# Patient Record
Sex: Male | Born: 1950
Health system: Southern US, Community
[De-identification: ages and names within clinical notes are randomized; demographics above are authoritative.]

## PROBLEM LIST (undated history)

## (undated) DIAGNOSIS — F32A Depression, unspecified: Secondary | ICD-10-CM

## (undated) DIAGNOSIS — I6523 Occlusion and stenosis of bilateral carotid arteries: Secondary | ICD-10-CM

## (undated) DIAGNOSIS — E785 Hyperlipidemia, unspecified: Secondary | ICD-10-CM

## (undated) DIAGNOSIS — I10 Essential (primary) hypertension: Secondary | ICD-10-CM

## (undated) DIAGNOSIS — C439 Malignant melanoma of skin, unspecified: Secondary | ICD-10-CM

## (undated) DIAGNOSIS — I2510 Atherosclerotic heart disease of native coronary artery without angina pectoris: Secondary | ICD-10-CM

## (undated) DIAGNOSIS — F329 Major depressive disorder, single episode, unspecified: Secondary | ICD-10-CM

## (undated) HISTORY — DX: Depression, unspecified: F32.A

## (undated) HISTORY — DX: Atherosclerotic heart disease of native coronary artery without angina pectoris: I25.10

## (undated) HISTORY — DX: Occlusion and stenosis of bilateral carotid arteries: I65.23

## (undated) HISTORY — DX: Hyperlipidemia, unspecified: E78.5

## (undated) HISTORY — DX: Major depressive disorder, single episode, unspecified: F32.9

## (undated) HISTORY — PX: FRACTURE SURGERY: SHX138

## (undated) HISTORY — DX: Essential (primary) hypertension: I10

## (undated) HISTORY — DX: Malignant melanoma of skin, unspecified: C43.9

---

## 1999-09-23 ENCOUNTER — Encounter: Payer: Self-pay | Admitting: Family Medicine

## 1999-09-23 ENCOUNTER — Encounter: Admission: RE | Admit: 1999-09-23 | Discharge: 1999-09-23 | Payer: Self-pay | Admitting: Family Medicine

## 1999-10-03 ENCOUNTER — Encounter: Admission: RE | Admit: 1999-10-03 | Discharge: 1999-10-03 | Payer: Self-pay | Admitting: Family Medicine

## 1999-10-03 ENCOUNTER — Encounter: Payer: Self-pay | Admitting: Family Medicine

## 2005-06-12 ENCOUNTER — Encounter: Admission: RE | Admit: 2005-06-12 | Discharge: 2005-06-12 | Payer: Self-pay | Admitting: Family Medicine

## 2005-08-14 HISTORY — PX: CORONARY ARTERY BYPASS GRAFT: SHX141

## 2006-07-13 ENCOUNTER — Inpatient Hospital Stay (HOSPITAL_COMMUNITY): Admission: EM | Admit: 2006-07-13 | Discharge: 2006-07-23 | Payer: Self-pay | Admitting: Emergency Medicine

## 2006-07-16 ENCOUNTER — Encounter: Payer: Self-pay | Admitting: Vascular Surgery

## 2006-07-18 ENCOUNTER — Encounter (INDEPENDENT_AMBULATORY_CARE_PROVIDER_SITE_OTHER): Payer: Self-pay | Admitting: Specialist

## 2006-08-09 ENCOUNTER — Encounter (HOSPITAL_COMMUNITY): Admission: RE | Admit: 2006-08-09 | Discharge: 2006-11-07 | Payer: Self-pay | Admitting: Cardiology

## 2006-08-17 ENCOUNTER — Emergency Department (HOSPITAL_COMMUNITY): Admission: EM | Admit: 2006-08-17 | Discharge: 2006-08-17 | Payer: Self-pay | Admitting: Emergency Medicine

## 2006-09-09 ENCOUNTER — Emergency Department (HOSPITAL_COMMUNITY): Admission: EM | Admit: 2006-09-09 | Discharge: 2006-09-09 | Payer: Self-pay | Admitting: Emergency Medicine

## 2007-04-29 ENCOUNTER — Ambulatory Visit: Payer: Self-pay | Admitting: Surgery

## 2007-05-17 ENCOUNTER — Emergency Department (HOSPITAL_COMMUNITY): Admission: EM | Admit: 2007-05-17 | Discharge: 2007-05-17 | Payer: Self-pay | Admitting: Emergency Medicine

## 2007-06-20 IMAGING — CR DG CHEST 2V
2 series · 2 of 2 positions shown · non-contrast
Comparison: 08/17/2006 and multiple previous films.

CLINICAL DATA: Chest pain.  Short of breath.  Assess for pneumonia.
 CHEST - 2 VIEW:

[w chest pa]
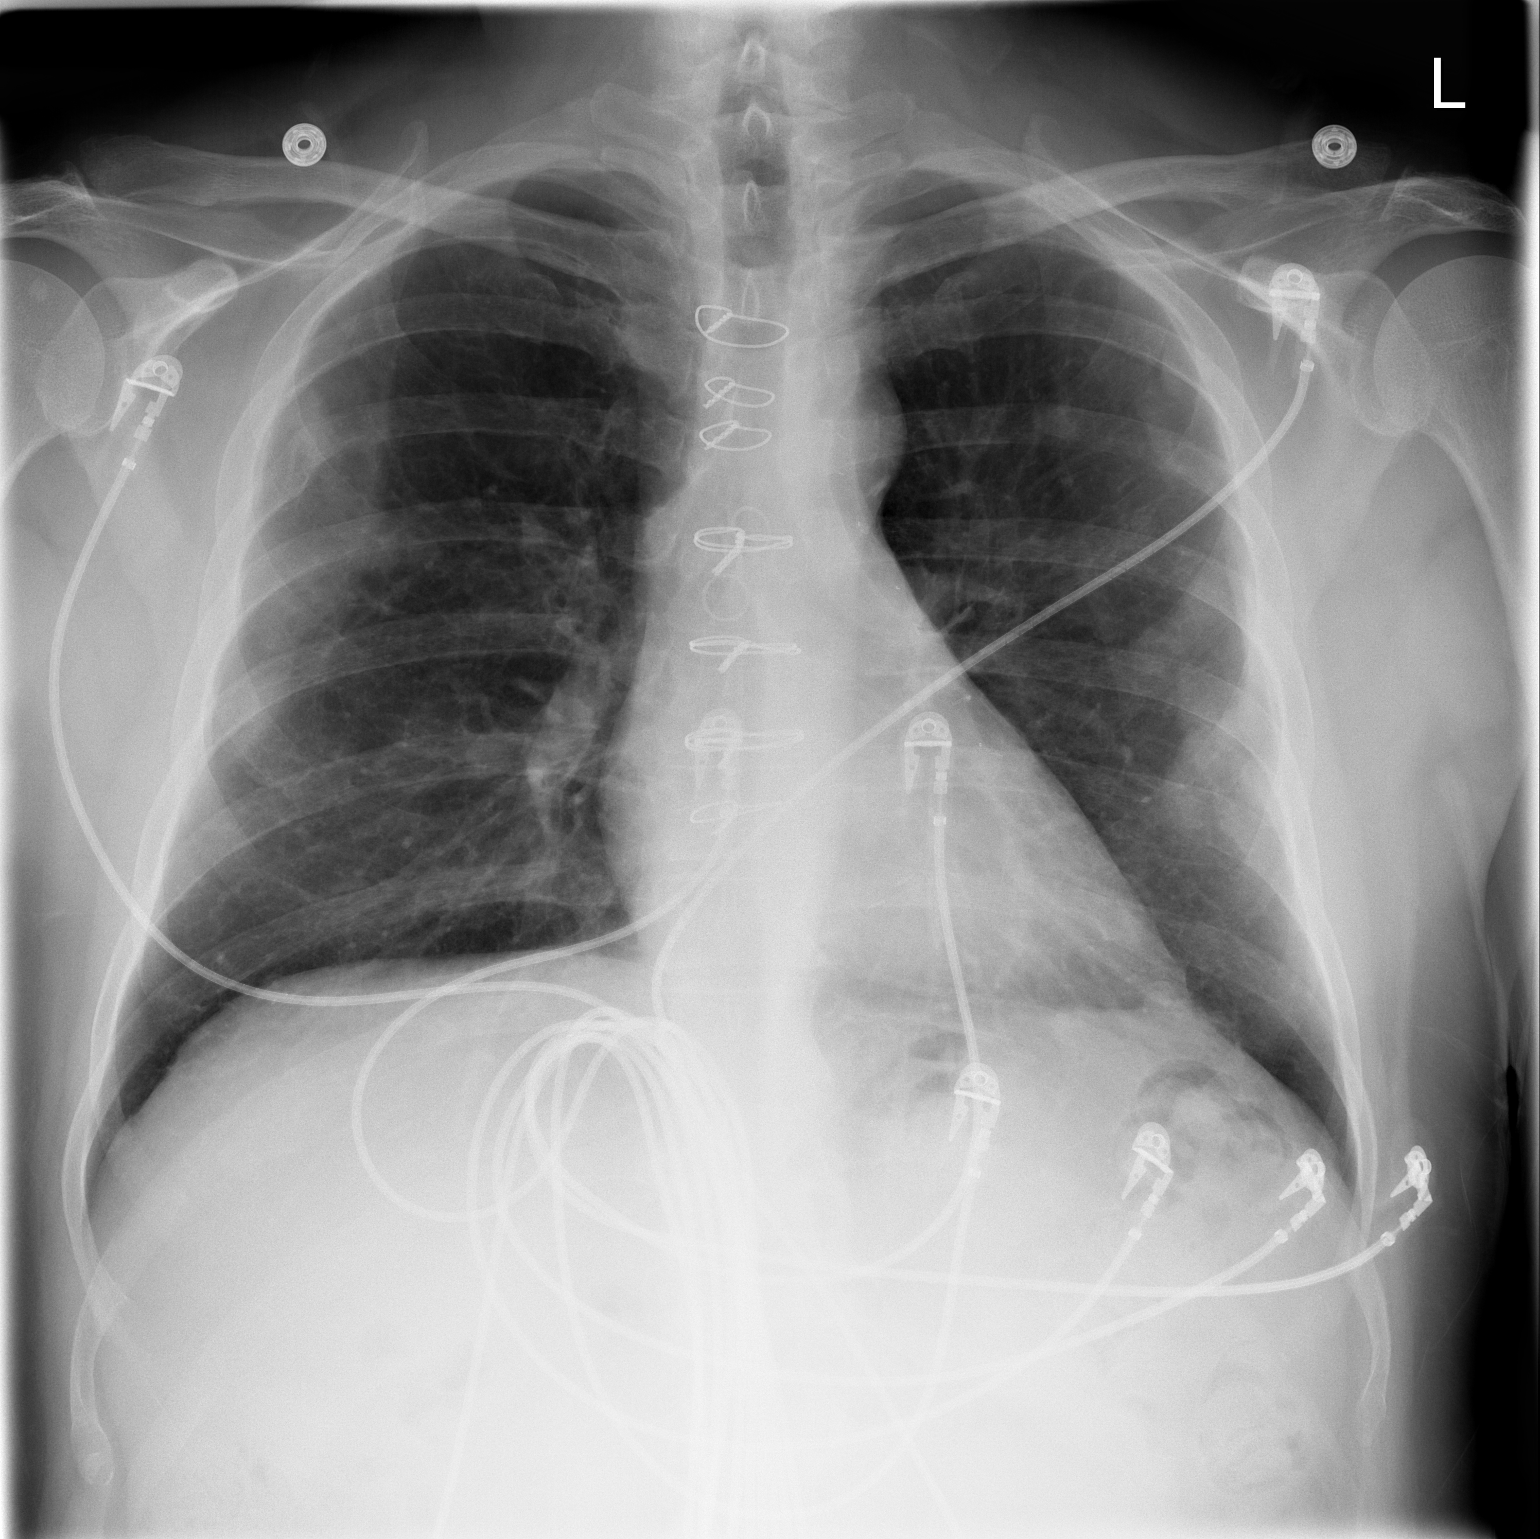

[w chest lat]
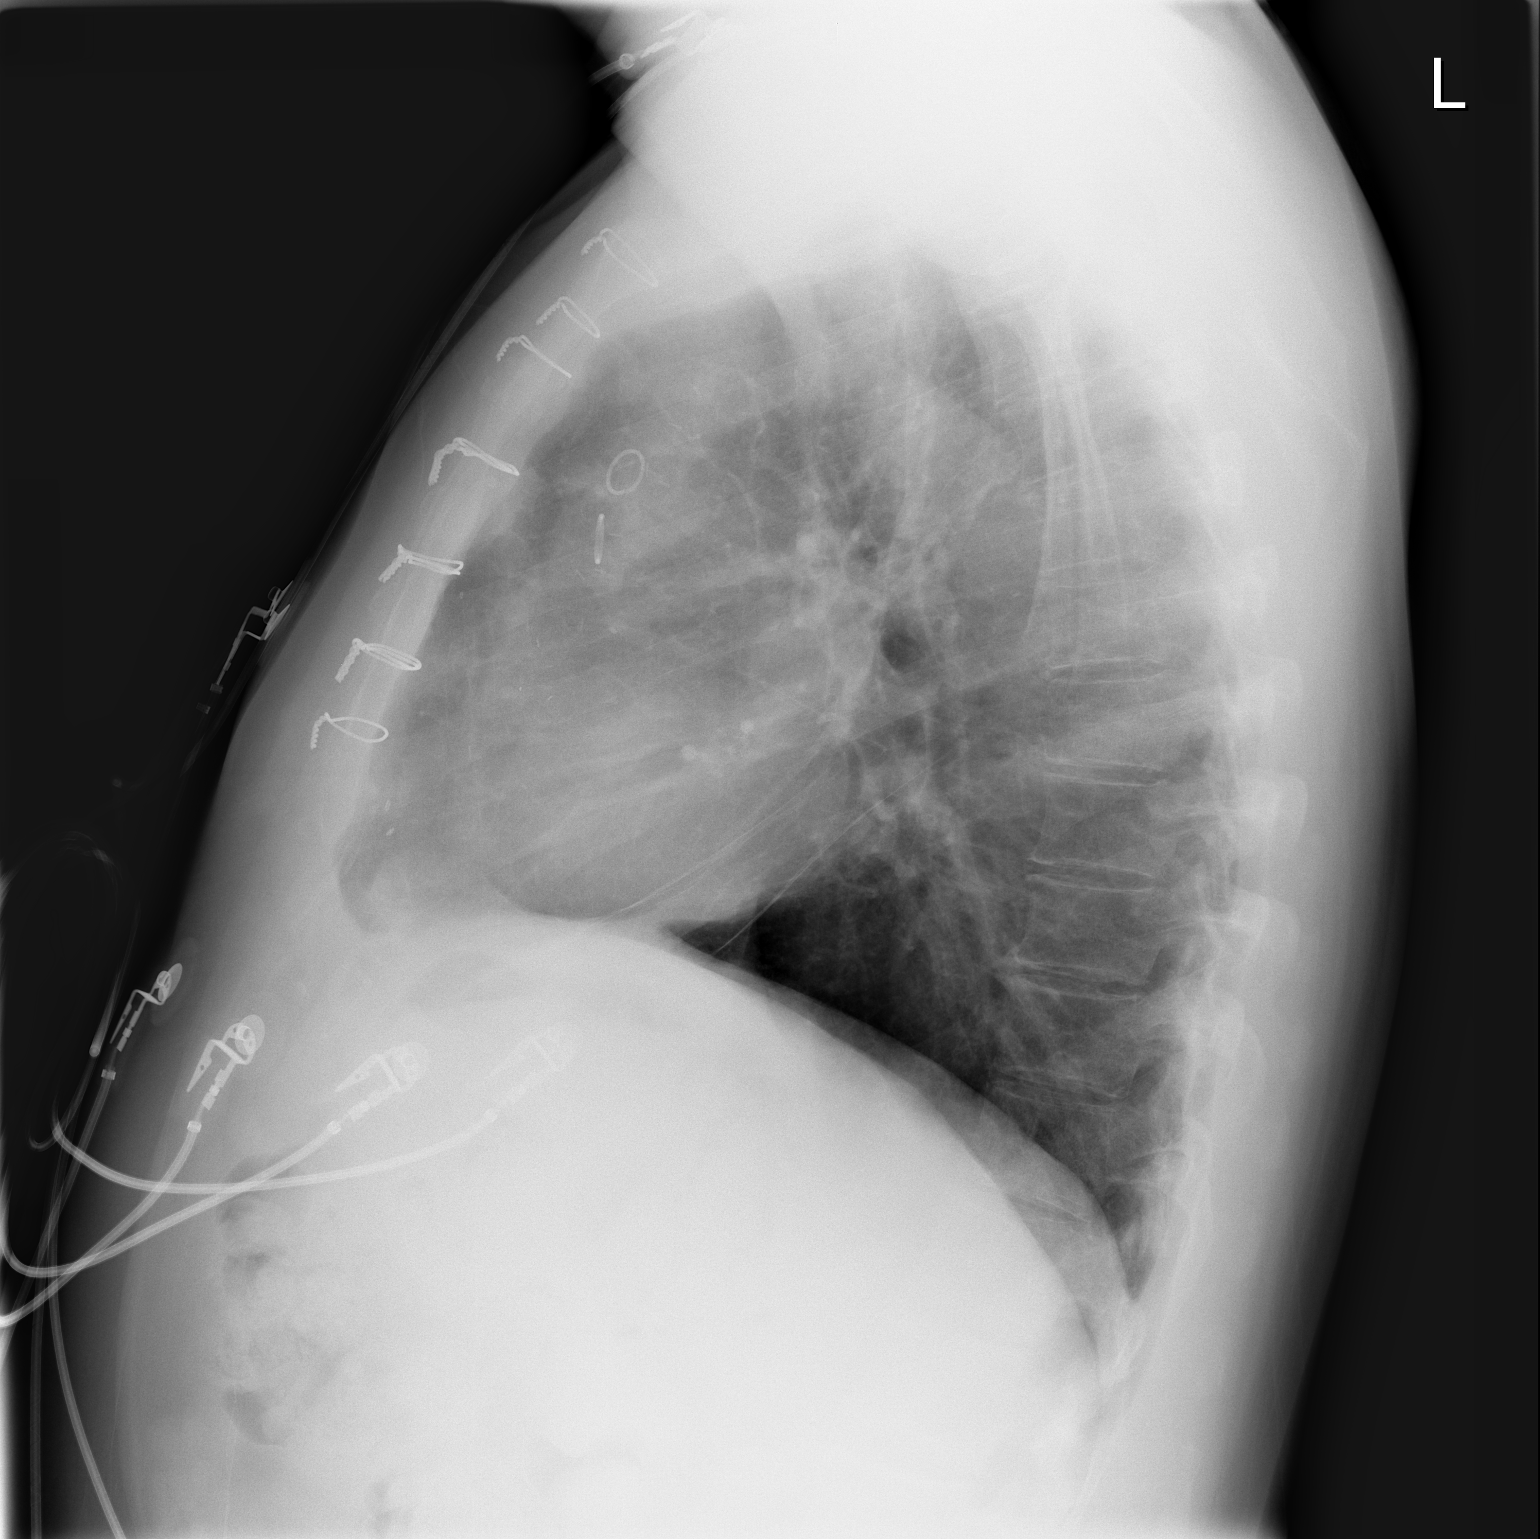

[2 of 2 positions shown; findings below may reference images not displayed]

FINDINGS: Artifact overlies the chest.  The patient has had a previous median sternotomy and coronary artery bypass grafting.  Pleural scarring remains evident bilaterally.  No evidence of acute infiltrate, mass, effusion or collapse.
IMPRESSION: 1.  Status post CABG. 
 2.  Chronic pleural and parenchymal densities. No sign of active process.

## 2008-08-14 DIAGNOSIS — C439 Malignant melanoma of skin, unspecified: Secondary | ICD-10-CM

## 2008-08-14 HISTORY — DX: Malignant melanoma of skin, unspecified: C43.9

## 2008-12-07 ENCOUNTER — Encounter: Admission: RE | Admit: 2008-12-07 | Discharge: 2008-12-07 | Payer: Self-pay | Admitting: Family Medicine

## 2009-08-18 ENCOUNTER — Emergency Department (HOSPITAL_COMMUNITY): Admission: EM | Admit: 2009-08-18 | Discharge: 2009-08-18 | Payer: Self-pay | Admitting: Emergency Medicine

## 2009-10-17 ENCOUNTER — Emergency Department (HOSPITAL_COMMUNITY): Admission: EM | Admit: 2009-10-17 | Discharge: 2009-10-18 | Payer: Self-pay | Admitting: Emergency Medicine

## 2009-10-18 ENCOUNTER — Inpatient Hospital Stay (HOSPITAL_COMMUNITY): Admission: AD | Admit: 2009-10-18 | Discharge: 2009-10-20 | Payer: Self-pay | Admitting: Psychiatry

## 2009-10-18 ENCOUNTER — Ambulatory Visit: Payer: Self-pay | Admitting: Psychiatry

## 2009-10-19 ENCOUNTER — Ambulatory Visit (HOSPITAL_COMMUNITY): Admission: RE | Admit: 2009-10-19 | Discharge: 2009-10-19 | Payer: Self-pay | Admitting: Psychiatry

## 2009-10-21 ENCOUNTER — Other Ambulatory Visit (HOSPITAL_COMMUNITY): Admission: RE | Admit: 2009-10-21 | Discharge: 2009-10-27 | Payer: Self-pay | Admitting: Psychiatry

## 2010-09-04 ENCOUNTER — Encounter: Payer: Self-pay | Admitting: Cardiology

## 2010-10-30 LAB — POCT I-STAT, CHEM 8
BUN: 12 mg/dL (ref 6–23)
Calcium, Ion: 1.24 mmol/L (ref 1.12–1.32)
Chloride: 105 meq/L (ref 96–112)
Creatinine, Ser: 1.2 mg/dL (ref 0.4–1.5)
Glucose, Bld: 119 mg/dL — ABNORMAL HIGH (ref 70–99)
HCT: 43 % (ref 39.0–52.0)
Hemoglobin: 14.6 g/dL (ref 13.0–17.0)
Potassium: 4.1 meq/L (ref 3.5–5.1)
Sodium: 139 meq/L (ref 135–145)
TCO2: 28 mmol/L (ref 0–100)

## 2010-10-30 LAB — POCT CARDIAC MARKERS
CKMB, poc: 1.9 ng/mL (ref 1.0–8.0)
Myoglobin, poc: 83.7 ng/mL (ref 12–200)
Troponin i, poc: <0.05 ng/mL (ref 0.00–0.09)
Troponin i, poc: <0.05 ng/mL (ref 0.00–0.09)

## 2010-10-30 LAB — COMPREHENSIVE METABOLIC PANEL WITH GFR
ALT: 23 U/L (ref 0–53)
AST: 25 U/L (ref 0–37)
Albumin: 4.1 g/dL (ref 3.5–5.2)
Alkaline Phosphatase: 34 U/L — ABNORMAL LOW (ref 39–117)
BUN: 10 mg/dL (ref 6–23)
CO2: 26 meq/L (ref 19–32)
Calcium: 9.9 mg/dL (ref 8.4–10.5)
Chloride: 103 meq/L (ref 96–112)
Creatinine, Ser: 1 mg/dL (ref 0.4–1.5)
GFR calc non Af Amer: >60 mL/min
Glucose, Bld: 116 mg/dL — ABNORMAL HIGH (ref 70–99)
Potassium: 3.6 meq/L (ref 3.5–5.1)
Sodium: 137 meq/L (ref 135–145)
Total Bilirubin: 0.8 mg/dL (ref 0.3–1.2)
Total Protein: 7.7 g/dL (ref 6.0–8.3)

## 2010-10-30 LAB — CBC
HCT: 40.5 % (ref 39.0–52.0)
MCV: 94.8 fL (ref 78.0–100.0)
Platelets: 225 10*3/uL (ref 150–400)
RBC: 4.28 MIL/uL (ref 4.22–5.81)
RDW: 13.1 % (ref 11.5–15.5)
WBC: 9.1 10*3/uL (ref 4.0–10.5)

## 2010-10-30 LAB — URINALYSIS, ROUTINE W REFLEX MICROSCOPIC
Bilirubin Urine: NEGATIVE
Glucose, UA: NEGATIVE mg/dL
Hgb urine dipstick: NEGATIVE
Ketones, ur: 15 mg/dL — AB
Nitrite: NEGATIVE
Protein, ur: NEGATIVE mg/dL
Specific Gravity, Urine: 1.018 (ref 1.005–1.030)
Urobilinogen, UA: 0.2 mg/dL (ref 0.0–1.0)
pH: 6 (ref 5.0–8.0)

## 2010-10-30 LAB — DIFFERENTIAL
Basophils Absolute: 0.1 K/uL (ref 0.0–0.1)
Basophils Relative: 2 % — ABNORMAL HIGH (ref 0–1)
Eosinophils Absolute: 0.3 K/uL (ref 0.0–0.7)
Eosinophils Relative: 3 % (ref 0–5)
Lymphocytes Relative: 20 % (ref 12–46)
Lymphs Abs: 1.8 K/uL (ref 0.7–4.0)
Monocytes Absolute: 0.8 K/uL (ref 0.1–1.0)
Monocytes Relative: 9 % (ref 3–12)
Neutro Abs: 6.1 K/uL (ref 1.7–7.7)
Neutrophils Relative %: 67 % (ref 43–77)

## 2010-10-30 LAB — LIPASE, BLOOD: Lipase: 26 U/L (ref 11–59)

## 2010-11-07 LAB — BASIC METABOLIC PANEL
CO2: 26 mEq/L (ref 19–32)
Calcium: 9.8 mg/dL (ref 8.4–10.5)
Chloride: 110 mEq/L (ref 96–112)
GFR calc Af Amer: >60 mL/min (ref >60)
Glucose, Bld: 87 mg/dL (ref 70–99)
Potassium: 4.1 mEq/L (ref 3.5–5.1)

## 2010-11-07 LAB — DIFFERENTIAL
Basophils Absolute: 0.3 10*3/uL — ABNORMAL HIGH (ref 0.0–0.1)
Basophils Relative: 4 % — ABNORMAL HIGH (ref 0–1)
Eosinophils Absolute: 0.2 10*3/uL (ref 0.0–0.7)
Eosinophils Relative: 2 % (ref 0–5)
Lymphocytes Relative: 29 % (ref 12–46)

## 2010-11-07 LAB — CBC
Hemoglobin: 14.7 g/dL (ref 13.0–17.0)
MCHC: 34.3 g/dL (ref 30.0–36.0)
MCV: 93.8 fL (ref 78.0–100.0)
RDW: 12.8 % (ref 11.5–15.5)

## 2010-11-07 LAB — RAPID URINE DRUG SCREEN, HOSP PERFORMED: Barbiturates: NOT DETECTED

## 2010-11-07 LAB — ETHANOL: Alcohol, Ethyl (B): <5 mg/dL (ref 0–10)

## 2010-12-27 NOTE — Procedures (Signed)
CAROTID DUPLEX EXAM   INDICATION:  Follow up known carotid artery disease.   HISTORY:  Diabetes:  No  Cardiac:  Yes  Hypertension:  Yes  Smoking:  Quit in 1990  Previous Surgery:  No  CV History:  No  Amaurosis Fugax No, Paresthesias No, Hemiparesis No                                       RIGHT             LEFT  Brachial systolic pressure:         120               130  Brachial Doppler waveforms:         Biphasic          Biphasic  Vertebral direction of flow:        Antegrade         Antegrade  DUPLEX VELOCITIES (cm/sec)  CCA peak systolic                   100               79  ECA peak systolic                   103               80  ICA peak systolic                   61                83  ICA end diastolic                   20                31  PLAQUE MORPHOLOGY:                  Heterogenous      Heterogenous  PLAQUE AMOUNT:                      Mild              Mild  PLAQUE LOCATION:                    ICA, ECA          ICA, ECA   IMPRESSION:  1-39% stenosis noted in the bilateral internal carotid  arteries.  Antegrade bilateral vertebral arteries.   ___________________________________________  Janetta Hora Fields, MD   MG/MEDQ  D:  04/29/2007  T:  04/30/2007  Job:  [id number]

## 2010-12-30 NOTE — H&P (Signed)
[name], [name]                ACCOUNT NO.:  [id number]   MEDICAL RECORD NO.:  [id number]          PATIENT TYPE:  INP   LOCATION:  3729                         FACILITY:  [facility]   PHYSICIAN:  Verdell Face Muse, PA    DATE OF BIRTH:  01-30-1951   DATE OF ADMISSION:  07/13/2006  DATE OF DISCHARGE:                              HISTORY & PHYSICAL   PRIMARY CARE PHYSICIAN:  L. Lupe Carney, M.D.   CARDIOLOGIST:  Corliss Marcus, M.D.   CHIEF COMPLAINT:  Chest pressure.   HISTORY OF PRESENT ILLNESS:  Mr. [name] [name] is a 60 year old Caucasian male  without a known history of coronary artery disease.  He has a history of  hypertension and hyperlipidemia with a recent exercise treadmill test  that was positive for inducible ischemia performed on July 09, 2006.  He presented to his primary care physician's office today with a chief  complaint of a sudden onset of nonexertional, anterior chest pressure  that started at 1:00 a.m. with radiation to his left breast with a  burning sensation, his left shoulder and left arm with mild numbness in  his left arm.  The duration was 6 hours with the chest pressure being  constant for the first 3 hours, then becoming intermittent.  He admits  to shortness of breath as well as episodes of dizziness and  (lightheadedness x2) with the duration of approximately 15 seconds.  There were no other associated symptoms.  There were no changes of the  chest pressure with deep inspiration, movement, or meals.  The patient  admits to a recent history of chest pressure for the past 7-8 weeks.  He  is currently chest pressure free but states it is difficult to take a  deep breath.  His EKG revealed sinus bradycardia at 58 beats per minute  without evidence of ischemia.   PAST MEDICAL HISTORY:  1. Hypertension.  2. Hyperlipidemia.  3. Recent diagnosis of prostate infection, currently on antibiotics   ALLERGIES:  Penicillin.   CURRENT MEDICATIONS:  1.  Simvastatin 40 mg daily.  2. HCTZ 25 mg daily.  3. Doxycycline monohydrate 100 mg twice daily x 30 days.   PAST SURGICAL HISTORY:  Vasectomy.   FAMILY HISTORY:  1. Father deceased age 83 - MI in his 77s.  2. Mother living age 87 - dementia.  3. Three brothers living ages 11, 42, and 60 - two with MI's in their      89s and another with a possible arrhythmia.  4. Sister living age 58 - healthy.   SOCIAL HISTORY:  Married to his second wife for 12 years with two adult  children - healthy.  He is a Naval architect.  He is a former smoker with a  24 pack year history with cessation 18 years ago.  He admits to 24  ounces of beer per week and denies illicit drug use.   REVIEW OF SYSTEMS:  All other systems reviewed are negative other than  what is stated in the HPI.   PHYSICAL EXAM:  GENERAL:  A 60 year old male, pleasant and cooperative,  NAD.  VITALS:  Temperature 97.7, blood pressure 145/91, pulse 57,  respirations 19, O2 saturations 99% over room air.  HEENT: Unremarkable.  NECK: Supple without JVD or carotid bruits, bilaterally.  PULMONARY:  Breath sounds are equal and clear to auscultation bilaterally.  No use  of accessory muscles.  CV bradycardia.  Normal S1 and S2.  No murmurs,  gallops, clicks or rubs.  ABDOMEN: Soft, nontender, nondistended with  active bowel sounds.  No masses, organomegaly, or bilateral bruits.  EXTREMITIES: No peripheral edema.  DP and PT pulses 2+/2, bilaterally.  SKIN:  Warm and dry without rashes or lesions.  NEURO: No focal sensory  or motor deficits.  PSYCH:  Normal mood and affect.   LABORATORY DATA:  White blood count 6.0, hemoglobin 14.3, hematocrit  40.9, platelets 259,000.  Sodium 138, potassium 3.7, chloride 107, CO2  25.9, BUN 18, creatinine 1.2, glucose 100, PT 13.9, INR 1.1, PTT 31, D-  dimer less than 0.22, BNP less than 30.0, magnesium 2.3, EKG as stated  in the HPI.  A chest x-ray bilateral pleural plaques.  No evidence of  cardiopulmonary  disease.  Point of care enzymes myoglobin 115, CK-MB  less than 1.0.  Troponin I less than 0.05.  Serial enzymes CK total 81,  CK-MB 1.4, troponin I 0.01.   ASSESSMENT:  1. Acute coronary syndrome consistent with unstable angina.  2. Recent treadmill test positive for inducible ischemia on July 09, 2006.  3. Hypertension, controlled.  4. Dyslipidemia.   PLAN:  1. MI ruled out.  Cardiac enzymes are negative. EKG is negative for      ischemic changes.  2. Admit to cardiac telemetry unit under the service of Corliss Marcus,      MD with the diagnosis of unstable angina.  3. Start subcu Lovenox every 12 hours per pharmacy protocol.  4. IV nitroglycerin at 3 mcg per minute.  Titrate at 5-10 mcg per      minute as necessary for chest pain relief keeping systolic blood      pressure greater than 100 mmHg.  5. Continue to cycle cardiac enzymes q.8 h x3.  6. Start coated aspirin 325 mg daily.  7. Continue home medications.  8. Diagnostic cardiac catheterization on Monday, July 16, 2006 to      be performed by Dr. Corliss Marcus.  The cardiac catheterization      procedure, risks, and potential complications were explained to the      patient by Dr. Amil Amen including MI, CVA, death, contrast dye      allergy, renal insufficiency, and vascular complications including      bleeding, hematoma, and pseudoaneurysm.  The patient admits to full      understanding of the information and wishes to proceed.  9. The patient was seen, interviewed, and examined by Dr. Amil Amen who      participated in the medical decision making and plan of care.      Tylene Fantasia, [place]    RDM/MEDQ  D:  07/13/2006  T:  07/15/2006  Job:  [id number]   cc:   L. Lupe Carney, M.D.

## 2010-12-30 NOTE — Consult Note (Signed)
NAMELOPEZ, DENTINGER NO.:  [id number]   MEDICAL RECORD NO.:  [id number]          PATIENT TYPE:  INP   LOCATION:  3729                         FACILITY:  [facility]   PHYSICIAN:  Sheliah Plane, MD    DATE OF BIRTH:  1950-11-25   DATE OF CONSULTATION:  07/16/2006  DATE OF DISCHARGE:                                 CONSULTATION   REQUESTING PHYSICIAN:  Armanda Magic, M.D.   Elenor Quinones CARDIOLOGIST:  Francisca December, M.D.   PRIMARY CARE PHYSICIAN:  L. Lupe Carney, M.D.   REASON FOR CONSULTATION:  Coronary occlusive disease.   HISTORY OF PRESENT ILLNESS:  The patient is a 60 year old white male  with a history of hyperlipidemia and hypertension who had noticed  increasing episodes of fatigue over the past several months, including  heaviness in his chest associated with shortness of breath which  precipitated his admission into the hospital 2 days ago.  Since  admission, he has had no other pain.  He noted the pain as a burning  sensation that radiated to the left breast and left shoulder with  shortness of breath and slight dizziness.  Denies diaphoresis.  On  November 26, he had undergone an exercise treadmill test which was  positive for inducible ischemia.  He was scheduled for a nuclear study  to be done next week, but because of the episode of prolonged chest  pain, was admitted on July 13, 2006, with unstable angina.   Cardiac risk factors include history of hypertension, history of  hyperlipidemia, history of recent prostate infection on doxycycline for  30 days.  He denies diabetes.  He is a remote smoker, having smoked  approximately 20 years and quit 18 years ago.  He has a positive  history.  His father had a myocardial infarction at age 52, ultimately  expired from complications from motor vehicle accident.  He has 3  brothers; 2 have had MIs, one sister who is healthy.  The patient has 2  adult children who are healthy.   PAST SURGICAL HISTORY:   None.   SOCIAL HISTORY:  The patient is married, employed as a Naval architect.  Drinks an occasional beer, no more than 2 per week.   MEDICATIONS ON ADMISSION:  1. Simvastatin 40 mg a day.  2. Hydrochlorothiazide 25 mg a day.  3. Doxycycline 100 mg b.i.d. for 30 days.  He is 1/2 through the      course.   DRUG ALLERGIES:  PENICILLIN causes a rash.   REVIEW OF SYSTEMS:  CARDIAC: Positive for exertional shortness of breath  and chest pain.  Denies lower extremity edema, denies palpitations,  denies syncope, presyncope, or orthopnea.  Denies any constitutional  symptoms, denies hemoptysis.  He has had no change in bowel habits.  He  does note that he had a sigmoidoscopy done in September that was clear.  Recent urinary tract infection/prostate infection, though he notes no  cultures were done.  Denies psychiatric history.  Denies easy  bruisability.   PHYSICAL EXAMINATION:  VITAL SIGNS: Blood pressure 128/70, pulse 60.  He  is  5 feet 8 inches tall, weighs 200 pounds.  GENERAL:  The patient is awake, alert, neurologically intact, and able  to relate his history without difficulty.  HEENT:  Pupils equal, round, and reactive to light.  NECK:  Without carotid bruits or jugular venous distention.  LUNGS: Clear bilaterally.  CARDIAC: Regular rate and rhythm without murmur or gallop.  ABDOMEN: Soft without palpable masses.  The aorta is not palpably  enlarged.  Right groin site is without significant hematoma.  VASCULAR: Exam reveals 2+ DP and PT pulses bilaterally.   LABORATORY DATA ON ADMISSION:  White count 6000, hematocrit 40.9.  Creatinine 1.2, BUN 18.  INR 1.1, PTT 31.  BNP less than 30.  Troponin,  CK-MB, and myoglobin were not elevated.   Cardiac catheterization was performed today by Dr. Mayford Knife which showed  70-80% mid right lesion with bifurcating distal vessel.  The left main  was without high-grade stenosis.  The proximal circumflex and first  obtuse marginal have 70-80%  stenosis.  Two small distal branches of the  circumflex are small, probably not bypassable.  The LAD has multiple 50%  lesions at the takeoff of a diagonal branch as much as 70%.   ASSESSMENT AND PLAN:  A patient with unstable anginal symptoms, positive  stress test, and significant 3-vessel disease not amenable to  angioplasty.  I agree with recommendation for coronary artery bypass  grafting.  The risks and options have been discussed with the patient  and his wife and daughter in detail, the risks of death, infection,  stroke, myocardial infarction, bleeding, and blood transfusion all  discussed in detail.  The patient had his questions answered and is  willing to proceed.  With his unstable anginal presentation, he should  remain on heparin in the hospital until surgery planned on Wednesday,  July 18, 2006.      Sheliah Plane, MD  Electronically Signed     EG/MEDQ  D:  07/16/2006  T:  07/16/2006  Job:  [id number]   cc:   Armanda Magic, M.D.  Francisca December, M.D.  Elsworth Soho, M.D.

## 2010-12-30 NOTE — Cardiovascular Report (Signed)
NAMESEVERINO, PAOLO                ACCOUNT NO.:  [id number]   MEDICAL RECORD NO.:  [id number]          PATIENT TYPE:  INP   LOCATION:  3729                         FACILITY:  [facility]   PHYSICIAN:  Armanda Magic, M.D.     DATE OF BIRTH:  1950/12/03   DATE OF PROCEDURE:  07/16/2006  DATE OF DISCHARGE:                            CARDIAC CATHETERIZATION   REFERRING PHYSICIAN:  L. Lupe Carney, M.D.   PROCEDURE:  Left heart catheterization, coronary angiography, left  ventriculography.   OPERATOR:  Armanda Magic, M.D.   INDICATIONS:  Chest pain and positive stress test.   COMPLICATIONS:  None.   IV ACCESS:  The right femoral artery 6-French sheath.   This is a very pleasant 60 year old white male who has been having  intermittent chest pain and presented for stress test which was  positive.  He had recurrent chest pain and was admitted the hospital. He  now presents for heart catheterization.   The patient is brought to cardiac catheterization laboratory in a  fasting nonsedated state.  Informed consent was obtained.  The patient  was connected to continuous heart rate and pulse oximetry monitoring and  intermittent blood pressure monitoring.  The right groin was prepped and  draped in sterile fashion.  1% Xylocaine was used for local anesthesia.  Using modified Seldinger technique a 6-French sheath was placed in right  femoral artery.  Under fluoroscopic guidance a 6-French JL-4 catheter  was placed left coronary artery.  Multiple cine films were taken at 30  degree RAO and 40 degree LAO views.  Catheter was exchanged out over a  guidewire for a 6-French JR-4 catheter was placed in fluoroscopic  guidance right coronary artery.  Multiple cine films were taken at 30  degree RAO and 40 degree LAO views.  Catheter was exchanged out over a  guidewire for 6-French angled pigtail catheter which was placed in  fluoroscopic guidance in left ventricular cavity.  Left ventriculography  was  performed in 30 degrees RAO view using total of 30 mL of contrast at  15 mL per second.  The catheter was then pulled back across the aortic  valve with no significant gradient noted.  At the end of procedure all  catheters and sheaths were removed.  Manual compression was performed  until adequate hemostasis was obtained.  The patient transferred back to  room in stable condition.   RESULTS:  1. Left main coronary artery is widely patent and bifurcates in left      anterior descending artery and left circumflex artery.  2. The left anterior descending artery is calcified in its proximal      portion with a 75-80% narrowing.  It gives rise to a first diagonal      which is very small and has a 90% ostial narrowing.  The ongoing      LAD gives rise to a second diagonal and just distal the second      diagonal there is a stenosis of the LAD of 60-70%.  And then the      distal LAD there is a 70-80%  stenosis.  3. The left circumflex is widely patent throughout its course giving      rise to a first obtuse marginal branch which is very small.  It      gives rise to a second larger obtuse marginal branch which is      widely patent and bifurcates into two daughter branches both of      which are widely patent.  It gives rise to a third obtuse marginal      branch which is widely patent.  The ongoing circumflex is widely      patent throughout its course distally.  4. The right coronary artery is widely patent proximal portion and      then in the midportion there is a 70-80% stenosis and distally a 30-      40% stenosis, before bifurcating into posterior descending artery      and posterior lateral artery both of which are widely patent.  5. Left ventriculography shows normal LV systolic function.  Aortic      pressure 131/56 mmHg, LV pressure 122/3 mmHg, LVEDP 17 mmHg.   ASSESSMENT:  1. Two-vessel coronary disease.  2. Normal LV function.  3. Angina.  4. Hypertension.  5.  Dyslipidemia.   PLAN:  CVTS consult, start IV heparin drip.      Armanda Magic, M.D.  Electronically Signed     TT/MEDQ  D:  07/16/2006  T:  07/16/2006  Job:  [id number]   cc:   L. Lupe Carney, M.D.

## 2010-12-30 NOTE — Op Note (Signed)
[name] [name], [name] [name]                ACCOUNT NO.:  [id number]   MEDICAL RECORD NO.:  [id number]          PATIENT TYPE:  INP   LOCATION:  2312                         FACILITY:  [facility]   PHYSICIAN:  Sheliah Plane, MD    DATE OF BIRTH:  [birth year]   DATE OF PROCEDURE:  07/18/2006  DATE OF DISCHARGE:                               OPERATIVE REPORT   PREOPERATIVE DIAGNOSIS:  Coronary occlusive disease, with unstable  angina.   POSTOPERATIVE DIAGNOSIS:  Coronary occlusive disease, with unstable  angina.   SURGICAL PROCEDURE:  Coronary artery bypass grafting x 3, with a left  internal mammary to the left anterior descending coronary artery,  reverse saphenous vein graft to the first obtuse marginal, reverse  saphenous vein graft to the posterior descending coronary artery, with  right leg Endovein harvesting.   SURGEON:  Sheliah Plane, M.D.   FIRST ASSISTANT:  Stephanie Acre. Dominick, P.A.   BRIEF HISTORY:  The patient is a 60 year old male who presented with  unstable anginal symptoms after a stress test done in Dr. Norris Cross  office.  The patient underwent cardiac catheterization which revealed  significant three-vessel coronary artery disease with sequential 70 and  80% lesions of the LAD, which was overall a very small vessel.  The  first obtuse marginal had a 70% lesion, and two smaller distal  circumflex branches were both too small to bypass, being less than 1 mm.  The right coronary artery had a mid to distal 80-90% stenosis.  Overall  ventricular function preserved.  Because of the patient's diffuse three-  vessel coronary artery disease, coronary artery bypass grafting was  recommended.  The patient agreed and signed informed consent.   DESCRIPTION OF THE PROCEDURE:  With Swan-Ganz and arterial line monitors  in place, the patient underwent general endotracheal anesthesia without  incident.  The skin of the chest and legs was prepped with Betadine and  draped in the usual  sterile manner.  Using a [employer] Endovein harvesting  system, vein was harvested from the right thigh and the upper part of  the right calf.  The vein was of adequate quality and caliber.  Median  sternotomy was performed.  Left internal mammary artery was dissected  down as a pedicle graft.  The distal artery was divided and had good  free flow.  Pericardium was opened.  Overall ventricular function  appeared preserved.  The patient was systemically heparinized.  The  ascending aorta was cannulated.  The right atrium cannulated.  The  patient was placed on cardiopulmonary bypass at 2.4 liters/minute/sq m.  Sites of anastomosis were selected and dissected at the epicardium.  The  patient's body temperature was cooled to 30 degrees.  Aortic cross-clamp  was applied, and 500 cc of cold blood potassium cardioplegia was  administered, with rapid diastolic arrest of the heart.  Myocardial  septal temperature was monitored throughout the cross-clamp.  Attention  was turned first to the takeoff of the posterior descending coronary  artery, which was opened and admitted a 1.5 mm probe.  Using a running 7-  0 Prolene, distal anastomosis was  performed.  Attention was then turned  to the obtuse marginal vessels.  The second and third obtuse marginal  vessels were extremely small, too small to bypass, less than 1 mm in  size.  The first obtuse marginal was intramyocardial.  It was dissected  out, and it too was a small vessel, but was opened and admitted a 1 mm  probe.  Using a running 7-0 Prolene, a distal anastomosis was performed.  Attention was then turned to the left anterior descending coronary  artery.  This vessel was also very small and diffusely diseased.  In the  distal third of the vessel, the artery was opened and admitted a 1 mm  probe distally and proximally.  The vessel was approximately 1.2-1.3 mm  in size.  Using a running 8-0 Prolene, the left internal mammary artery  was  anastomosed to the left anterior descending coronary artery.  With  the aortic cross-clamp still in place, two punch aortotomies were  performed on the ascending aorta.  Each of the two vein grafts were  anastomosed to the ascending aorta.  Air was evacuated from the grafts.  The bulldog was removed from the mammary artery, with prompt rise in  myocardial septal temperature.  The heart was allowed to passively fill,  and the ascending aorta was de-aired. Aortic cross-clamp was removed.  Total cross-clamp time was 65 minutes.  Sites of anastomosis were  inspected and free of bleeding.  The patient was then ventilated and  weaned from cardiopulmonary bypass without difficulty.  Atrial and  ventricular pacing wires were applied.  Two graft markers were applied.  With the operative field hemostatic, a left pleural tube and a  mediastinal Blake drain were left pin place.  Pericardium was  reapproximated.  Sternum was closed with #6 stainless steel wire.  Fascia was closed with interrupted 0 Vicryl, running 3-0 Vicryl in the  subcutaneous tissue, 4-0 subcuticular stitch in the skin edges.  Dry  dressings were applied.  Sponge and needle count was reported as correct  at the completion of the procedure.  The patient tolerated the procedure  without obvious complication and was transferred to the surgical  intensive care unit for further postoperative care.      Sheliah Plane, MD  Electronically Signed     EG/MEDQ  D:  07/18/2006  T:  07/18/2006  Job:  [id number]   cc:   Francisca December, M.D.  Armanda Magic, M.D.  Elsworth Soho, M.D.

## 2010-12-30 NOTE — Discharge Summary (Signed)
NAMEESTIL, VALLEE NO.:  [id number]   MEDICAL RECORD NO.:  [id number]          PATIENT TYPE:  INP   LOCATION:  2034                         FACILITY:  [facility]   PHYSICIAN:  Sheliah Plane, MD    DATE OF BIRTH:  1951/01/07   DATE OF ADMISSION:  07/13/2006  DATE OF DISCHARGE:  07/23/2006                               DISCHARGE SUMMARY   PRIMARY DIAGNOSES:  Coronary occlusive disease  with unstable angina.   HOSPITAL DIAGNOSES:  1. Postoperative atrial fibrillation.  2. Postoperative and bilateral pneumothorax.  3. Postoperative acute blood loss anemia.  4. Postoperative volume overload.   SECONDARY DIAGNOSES:  1. Hypertension.  2. Hyperlipidemia.  3. Recent diagnosis of prostate infection currently on antibiotics.  4. Status post vasectomy.   IN-HOSPITAL OPERATIONS AND PROCEDURES:  1. Cardiac catheterization with left heart catheterization, coronary      angiography, left ventriculography.  2. Coronary bypass grafting x3 with the left internal mammary artery      to left anterior descending coronary artery, reverse saphenous vein      graft to first obtuse marginal, reverse saphenous vein graft to      posterior descending coronary artery.  Endoscopic vein harvesting      done.  3. Placement of left anterior chest tube.   THE PATIENT'S HISTORY AND PHYSICAL AND HOSPITAL COURSE:  The patient is  a 60 year old male patient with unstable angina symptoms after stress  test done in Dr. Norris Cross office.  The patient underwent cardiac  catheterization which revealed a significant three-vessel coronary  artery disease with sequential 70-80% lesion of the LAD.  The first  obtuse marginal had 30% lesion, two smaller distal circumflex (both too  small to bypass, being less than 1 mm).  Right coronary artery had a  mixed distal 80-90% stenosis.  Overall ventricular function was  preserved.  Following catheterization, Dr. Tyrone Sage was consulted.  Dr.  Tyrone Sage  discussed with the patient undergoing coronary bypass grafting.  He discussed risks and benefits with the patient.  The patient  acknowledged understanding and agreed to proceed.  Surgery was scheduled  for July 18, 2006.  Prior to undergoing surgery, the patient had  bilateral carotid duplex ultrasound done which showed on the right to  have no significant ICA stenosis.  On the left, there was 40-60% ICA  stenosis.  The patient also had ABIs done showing the bilateral greater  than 1.0.  For details of the patient's past medical history and  physical exam, please see dictated History and Physical.   The patient was taken to the operating room on July 18, 2006, where  he underwent coronary bypass grafting x3 using left internal mammary  artery to left anterior descending coronary artery, reverse saphenous  vein graft to first obtuse marginal, reverse saphenous vein graft to  posterior descending coronary artery.  The right leg endoscopic vein  harvesting was done.  The patient tolerated this procedure well and was  transferred to the intensive care unit in stable condition.   The day following surgery, the patient was seen to be hemodynamically  stable with hematocrit of 30%.  The patient was extubated the evening of  surgery.  The patient was seen to be alert and oriented x4 and  neurologically intact.  During the patient's postoperative course, he  developed rapid atrial fibrillation on postop day #2. The patient was  started on IV amiodarone at that time.  He did convert back to normal  sinus rhythm with IV amiodarone.  He was switched from IV to p.o.  amiodarone.  The patient did have, as noted above, atrial fibrillation  that converted back to normal sinus rhythm on its own.  He remained in  normal sinus rhythm following the use of amiodarone and Lopressor.  During the patient's postoperative course, chest x-ray was obtained  following removal of chest tube.  This showed  10-15% bilateral  pneumothorax.  Following recent evaluation of chest x-ray, Dr. Tyrone Sage  placed a left anterior chest tube.  No complications were noted.  Follow-  up chest x-ray showed complete resolution pneumothorax.   The patient also developed acute blood loss anemia postoperatively.  This was monitored closely.  Hemoglobin and hematocrit remained stable  with last labs obtained showing a hemoglobin of 9.9 and hematocrit of  20.0.   The patient developed volume overload postoperatively for which he was  started on diuretics.  The patient continued on diuretics during  hospital course and patient's discharge weight was near his preoperative  weight.  The patient postoperatively continued to improve.  He was out  of bed ambulating well with assistance.  He was tolerating his diet  well.  No nausea, vomiting noted.  Incisions were clean, dry and intact  and healing well.  Vital signs monitored and seen to be stable.  The  patient remained afebrile during postoperative course.  Noted the  patient to be in normal sinus rhythm prior to discharge home.   FOLLOWUP APPOINTMENTS:  A followup appointment will be arranged with Dr.  Tyrone Sage in 3 weeks.  Our office to contact the patient with this  information.  The patient will follow up with Dr. Mayford Knife on August 03, 2006, at 3:15 p.m..  The patient will obtain a PA and lateral chest x-  ray at 2:30 p.m. on December 21.  An EKG is also scheduled at Dr.  Norris Cross office for July 30, 2006, at 10:30 a.m.Marland Kitchen   ACTIVITY:  Mr. Tallo was instructed no driving until released to do so,  no lifting over 10 pounds.  He is told to ambulate 3-4 times, progress  as tolerated, and to continue breathing exercises.   INCISIONAL CARE:  The patient was told to shower, washing his incisions  using soap and water.  He is to contact the office if he develops any  drainage or opening from any of his incision sites.  DIET:  The patient was instructed on  diet to be low-fat, low-salt.   DISCHARGE MEDICATIONS:  1. Aspirin 325 mg daily.  2. Toprol XL 25 mg daily.  3. Zocor 40 mg daily.  4. Doxycycline 100 mg b.i.d. x7 days.  5. Amiodarone 200 mg b.i.d. x1 week, then 200 mg daily.  6. Lasix 40 mg daily.  7. Potassium chloride 20 mEq once daily x5 days.  8. Oxycodone 1-2 tablets q. 4-6 h p.r.n. pain.  9. Ultram 50 mg 1-2 tablets q. 4-6 h.  l      Stephanie Acre Jet, [place]      Sheliah Plane, MD  Electronically Signed    KMD/MEDQ  D:  07/23/2006  T:  07/23/2006  Job:  [id number]   cc:   Armanda Magic, M.D.

## 2011-05-25 LAB — CBC
Platelets: 275
RBC: 4.49
WBC: 7.9

## 2011-05-25 LAB — DIFFERENTIAL
Lymphs Abs: 2.4
Monocytes Relative: 11
Neutro Abs: 4.4
Neutrophils Relative %: 56

## 2011-05-25 LAB — POCT I-STAT CREATININE
Creatinine, Ser: 1.1
Operator id: 277751

## 2011-05-25 LAB — I-STAT 8, (EC8 V) (CONVERTED LAB)
Chloride: 107
HCT: 44
Hemoglobin: 15
Operator id: 277751
Potassium: 4.1
TCO2: 25
pCO2, Ven: 35.6 — ABNORMAL LOW

## 2011-05-25 LAB — D-DIMER, QUANTITATIVE: D-Dimer, Quant: <0.22

## 2011-05-25 LAB — POCT CARDIAC MARKERS
CKMB, poc: <1 — ABNORMAL LOW
Myoglobin, poc: 62.1
Troponin i, poc: <0.05

## 2013-07-17 ENCOUNTER — Telehealth: Payer: Self-pay | Admitting: Cardiology

## 2013-07-17 DIAGNOSIS — I2510 Atherosclerotic heart disease of native coronary artery without angina pectoris: Secondary | ICD-10-CM

## 2013-07-17 DIAGNOSIS — I10 Essential (primary) hypertension: Secondary | ICD-10-CM

## 2013-07-17 DIAGNOSIS — E7800 Pure hypercholesterolemia, unspecified: Secondary | ICD-10-CM

## 2013-07-17 NOTE — Telephone Encounter (Signed)
To Dr Turner to advise 

## 2013-07-17 NOTE — Telephone Encounter (Signed)
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  Can he have this?

## 2013-07-21 NOTE — Telephone Encounter (Signed)
Follow Up:  Pt is requesting a call back from the nurse. States she is checking to see is she can have a stress test before the end of the year.

## 2013-07-21 NOTE — Telephone Encounter (Signed)
Please order a stress myoview and he needs yearly followup with me

## 2013-07-21 NOTE — Telephone Encounter (Signed)
Sent to [place] to work in pt for Stress test and annual visit.

## 2013-08-04 ENCOUNTER — Encounter (HOSPITAL_COMMUNITY): Payer: Self-pay

## 2013-08-04 ENCOUNTER — Encounter: Payer: Self-pay | Admitting: General Surgery

## 2013-08-04 DIAGNOSIS — E785 Hyperlipidemia, unspecified: Secondary | ICD-10-CM | POA: Insufficient documentation

## 2013-08-04 DIAGNOSIS — Z79899 Other long term (current) drug therapy: Secondary | ICD-10-CM

## 2013-08-04 DIAGNOSIS — I2510 Atherosclerotic heart disease of native coronary artery without angina pectoris: Secondary | ICD-10-CM

## 2013-08-04 DIAGNOSIS — E7800 Pure hypercholesterolemia, unspecified: Secondary | ICD-10-CM

## 2013-08-04 DIAGNOSIS — I10 Essential (primary) hypertension: Secondary | ICD-10-CM | POA: Insufficient documentation

## 2013-08-05 ENCOUNTER — Telehealth: Payer: Self-pay | Admitting: Cardiology

## 2013-08-05 ENCOUNTER — Encounter (HOSPITAL_COMMUNITY): Payer: Self-pay

## 2013-08-05 NOTE — Telephone Encounter (Signed)
Pt's appeal for his nuclear stress test that is required for his DOT licence renewal was faxed on the following dates:07-31-13 to [phone] (contained verbal order to represent mbr, per the mbr),08-04-13 to fax#[phone] per [facility] personnel instructions and also 08-05-13 to [phone].  Both the 12-22 and 08-05-13 faxes contained the actual signature from the member on the form required to represent mbr for the appeal.  Spoke [email or web address]  on 12-22 and 08-05-13.  As of 08-05-13 they haven't received the appeal pkg I sent.  They state it is waiting to be found and uploaded in system.  I will check again on 08-06-13 to see if appeal pkg has been located.  Can take up to 30 days for appeal.  Pt's test has been cancelled until we can get authorization for test.  Last day for mbr to get clearance for DOT renewal is 08-19-13.  Dr. Mayford Knife is aware of situation and in her absence, nuc reader can authorize renewal should we get authorization.  Pt aware and understands

## 2013-08-12 ENCOUNTER — Telehealth: Payer: Self-pay | Admitting: Cardiology

## 2013-08-12 NOTE — Telephone Encounter (Signed)
Appeal for nuc stress test denied by [facility].  Pt to wait for Dr. Mayford Knife for further instruction for DOT clearance.

## 2013-08-12 NOTE — Telephone Encounter (Signed)
Please have patient check with his DOT to determine if ETT will work instead of nuclear stress test.  Have him explain to his work that his insurance will not cover a nuclear stress test

## 2013-08-13 ENCOUNTER — Other Ambulatory Visit: Payer: Self-pay | Admitting: General Surgery

## 2013-08-13 DIAGNOSIS — I2510 Atherosclerotic heart disease of native coronary artery without angina pectoris: Secondary | ICD-10-CM

## 2013-08-13 NOTE — Telephone Encounter (Signed)
Pt stated that a ETT is fine. The only reason he states we have been doing nuclears is because his ETT started coming back abnormal. He said he was ok with  ETT. OK to set pt up? If so what Dx code do you want me to use?

## 2013-08-13 NOTE — Telephone Encounter (Signed)
CAD.

## 2013-08-15 ENCOUNTER — Ambulatory Visit (HOSPITAL_COMMUNITY)
Admission: RE | Admit: 2013-08-15 | Discharge: 2013-08-15 | Disposition: A | Discharge status: Home / Self Care | Payer: 59 | Source: Ambulatory Visit | Attending: Cardiology | Admitting: Cardiology

## 2013-08-15 DIAGNOSIS — I2510 Atherosclerotic heart disease of native coronary artery without angina pectoris: Secondary | ICD-10-CM | POA: Insufficient documentation

## 2013-08-15 NOTE — Telephone Encounter (Signed)
Ordered pt is aware and coming in today for ETT at [facility].

## 2013-08-18 ENCOUNTER — Telehealth: Payer: Self-pay | Admitting: Cardiology

## 2013-08-18 ENCOUNTER — Encounter: Payer: Self-pay | Admitting: Cardiology

## 2013-08-18 ENCOUNTER — Ambulatory Visit (INDEPENDENT_AMBULATORY_CARE_PROVIDER_SITE_OTHER): Payer: 59 | Admitting: Cardiology

## 2013-08-18 VITALS — BP 130/78 | HR 55 | Ht 68.0 in | Wt 225.0 lb

## 2013-08-18 DIAGNOSIS — E7800 Pure hypercholesterolemia, unspecified: Secondary | ICD-10-CM

## 2013-08-18 DIAGNOSIS — I10 Essential (primary) hypertension: Secondary | ICD-10-CM

## 2013-08-18 DIAGNOSIS — Z951 Presence of aortocoronary bypass graft: Secondary | ICD-10-CM | POA: Insufficient documentation

## 2013-08-18 DIAGNOSIS — I2510 Atherosclerotic heart disease of native coronary artery without angina pectoris: Secondary | ICD-10-CM

## 2013-08-18 NOTE — Patient Instructions (Signed)
Your physician recommends that you continue on your current medications as directed. Please refer to the Current Medication list given to you today.  Your physician recommends that you return for lab work on 08/31/13 for fasting Lipid and ALT Panel  Your physician wants you to follow-up in: 1 Year with Dr Mallie Snooks will receive a reminder letter in the mail two months in advance. If you don't receive a letter, please call our office to schedule the follow-up appointment.

## 2013-08-18 NOTE — Telephone Encounter (Signed)
Pt made aware at appt today.

## 2013-08-18 NOTE — Progress Notes (Signed)
[place], [address] [place], [place]  [address] Phone: [phone] Fax:  [phone]  Date:  08/18/2013   ID:  [name] [name], DOB March 25, 1951, MRN [id number]  PCP:  Donnie Coffin, MD  Cardiologist:  Fransico Him, MD     History of Present Illness: [name] [name] is a 63 y.o. male with a history of ASCAD, HTN and dyslipidemia.  He is doing well.  He denies any chest pain, SOB, DOE, LE edema, dizziness, palpitations or syncope.  He recently had a DOT ETT done which showed nonspecific upsloping ST segement depression and was low risk with no evidence of ischemia.   Wt Readings from Last 3 Encounters:  08/18/13 225 lb (102.059 kg)  08/04/13 223 lb 12.8 oz (101.515 kg)     Past Medical History  Diagnosis Date  . Dyslipidemia   . Bilateral carotid artery stenosis     Mild  . Melanoma 2010    of the scalp Dr Wendee Beavers Harvel Quale  . Depression     Dr Charissa Bash  . Hypertension   . Coronary artery disease     s/pCABG    Current Outpatient Prescriptions  Medication Sig Dispense Refill  . Acetaminophen (TYLENOL EXTRA STRENGTH PO) Take 500 mg by mouth as needed.      Marland Kitchen aspirin 325 MG tablet Take 325 mg by mouth daily.      . Calcium Carb-Cholecalciferol (CALCIUM 500 +D PO) Take 1 tablet by mouth daily.      Marland Kitchen ezetimibe (ZETIA) 10 MG tablet Take 10 mg by mouth daily.      . famotidine (PEPCID) 20 MG tablet Take 20 mg by mouth 2 (two) times daily.      Marland Kitchen FLUoxetine (PROZAC) 20 MG capsule Take 20 mg by mouth daily.      . fluticasone (FLONASE) 50 MCG/ACT nasal spray Place 1 spray into both nostrils as needed for allergies or rhinitis.      Marland Kitchen loratadine (CLARITIN) 10 MG tablet Take 10 mg by mouth daily.      . metoprolol tartrate (LOPRESSOR) 25 MG tablet Take 12.5 mg by mouth 2 (two) times daily.      . Multiple Vitamin (MULTIVITAMIN) tablet Take 1 tablet by mouth daily.      . Omega-3 Fatty Acids (FISH OIL) 1000 MG CAPS Take 1 capsule by mouth 2 (two) times daily.      . rosuvastatin  (CRESTOR) 40 MG tablet Take 40 mg by mouth daily.      . vitamin C (ASCORBIC ACID) 500 MG tablet Take 500 mg by mouth daily.       No current facility-administered medications for this visit.    Allergies:    Allergies  Allergen Reactions  . Penicillins Rash    Social History:  The patient  reports that he quit smoking about 25 years ago. He does not have any smokeless tobacco history on file. He reports that he drinks alcohol. He reports that he does not use illicit drugs.   Family History:  The patient's family history is not on file.   ROS:  Please see the history of present illness.      All other systems reviewed and negative.   PHYSICAL EXAM: VS:  Ht 5\' 8"  (1.727 m)  Wt 225 lb (102.059 kg)  BMI 34.22 kg/m2 Well nourished, well developed, in no acute distress HEENT: normal Neck: no JVD Cardiac:  normal S1, S2; RRR; no murmur Lungs:  clear to auscultation bilaterally, no wheezing,  rhonchi or rales Abd: soft, nontender, no hepatomegaly Ext: no edema Skin: warm and dry Neuro:  CNs 2-12 intact, no focal abnormalities noted  EKG:  Sinus bradycardia     ASSESSMENT AND PLAN:  1. ASCAD with no angina  - continue ASA  2. HTN - well controlled   - continue metoprolol 3. Dyslipidemia  - continue fish oil/Zetia and crestor  - check fasting lipids and ALT   - I encouraged him to start exercising more  Followup with me in 1 year  Signed, Fransico Him, MD 08/18/2013 11:01 AM

## 2013-08-18 NOTE — Telephone Encounter (Signed)
Please let patient know that stress test was low risk

## 2013-09-01 ENCOUNTER — Other Ambulatory Visit: Payer: 59

## 2014-04-07 ENCOUNTER — Other Ambulatory Visit: Payer: Self-pay

## 2014-04-07 MED ORDER — ROSUVASTATIN CALCIUM 40 MG PO TABS: 40.0000 mg | ORAL_TABLET | Freq: Every day | ORAL | Status: DC

## 2014-06-01 ENCOUNTER — Other Ambulatory Visit: Payer: Self-pay | Admitting: *Deleted

## 2014-06-01 MED ORDER — METOPROLOL TARTRATE 25 MG PO TABS: 12.5000 mg | ORAL_TABLET | Freq: Two times a day (BID) | ORAL | Status: DC

## 2014-07-20 ENCOUNTER — Telehealth: Payer: Self-pay | Admitting: Cardiology

## 2014-07-20 DIAGNOSIS — Z0289 Encounter for other administrative examinations: Secondary | ICD-10-CM

## 2014-07-20 NOTE — Telephone Encounter (Signed)
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

## 2014-07-20 NOTE — Telephone Encounter (Signed)
Informed patient that message will be forward to Dr. Radford Pax and Lenice Llamas RN when they are back in the off on Tuesday.  Patient requesting stress test for his DOT physical.

## 2014-07-21 NOTE — Telephone Encounter (Signed)
Informed patient that per Dr. Radford Pax, an exercise treadmill test is being ordered and a scheduler will call him to make an appointment. Patient agrees with treatment plan.

## 2014-07-21 NOTE — Telephone Encounter (Signed)
That is fine 

## 2014-07-29 NOTE — Telephone Encounter (Signed)
Left message to call back for GXT instructions.

## 2014-08-04 NOTE — Telephone Encounter (Signed)
Left message on private voicemail that GXT has been ordered and further instructions will be given when he is called to schedule. Instructed patient to call with any questions.

## 2014-08-13 ENCOUNTER — Encounter: Payer: Self-pay | Admitting: Cardiology

## 2014-12-06 ENCOUNTER — Other Ambulatory Visit: Payer: Self-pay | Admitting: Cardiology

## 2015-01-04 ENCOUNTER — Telehealth: Payer: Self-pay | Admitting: Cardiology

## 2015-01-04 MED ORDER — ROSUVASTATIN CALCIUM 40 MG PO TABS: 40.0000 mg | ORAL_TABLET | Freq: Every day | ORAL | Status: DC

## 2015-01-04 NOTE — Telephone Encounter (Signed)
Informed patient he will need an OV with Dr. Radford Pax to continue receiving Rx renewals.  Scheduled patient for 6/27. Patient understands he must come to appointment for further refills.

## 2015-01-04 NOTE — Telephone Encounter (Signed)
New problem    Pt need his crestor prescription renewed. Please advise.

## 2015-01-11 ENCOUNTER — Other Ambulatory Visit: Payer: Self-pay | Admitting: Cardiology

## 2015-01-12 ENCOUNTER — Other Ambulatory Visit (INDEPENDENT_AMBULATORY_CARE_PROVIDER_SITE_OTHER): Payer: Medicare Other | Admitting: *Deleted

## 2015-01-12 DIAGNOSIS — E7800 Pure hypercholesterolemia, unspecified: Secondary | ICD-10-CM

## 2015-01-12 DIAGNOSIS — I10 Essential (primary) hypertension: Secondary | ICD-10-CM

## 2015-01-12 LAB — ALT: ALT: 25 U/L (ref 0–53)

## 2015-01-12 LAB — LIPID PANEL
Cholesterol: 173 mg/dL (ref 0–200)
HDL: 39.2 mg/dL (ref >39.00)
LDL CALC: 100 mg/dL — AB (ref 0–99)
NonHDL: 133.8
Total CHOL/HDL Ratio: 4
Triglycerides: 167 mg/dL — ABNORMAL HIGH (ref 0.0–149.0)
VLDL: 33.4 mg/dL (ref 0.0–40.0)

## 2015-01-12 NOTE — Addendum Note (Signed)
Addended by: Eulis Foster on: 01/12/2015 12:18 PM   Modules accepted: Orders

## 2015-01-12 NOTE — Addendum Note (Signed)
Addended by: Eulis Foster on: 01/12/2015 12:19 PM   Modules accepted: Orders

## 2015-01-21 ENCOUNTER — Telehealth: Payer: Self-pay | Admitting: Cardiology

## 2015-01-21 ENCOUNTER — Other Ambulatory Visit: Payer: Self-pay

## 2015-01-21 DIAGNOSIS — E7800 Pure hypercholesterolemia, unspecified: Secondary | ICD-10-CM

## 2015-01-21 NOTE — Telephone Encounter (Signed)
Follow Up  Pt called states that he was advised that he would be entered into the "[facility]" Please call the pt back for clarification//sr

## 2015-01-21 NOTE — Telephone Encounter (Signed)
Informed patient a reminder has been sent to [facility] to schedule [facility] appointment. Patient grateful for callback.

## 2015-02-03 ENCOUNTER — Other Ambulatory Visit: Payer: Self-pay | Admitting: Cardiology

## 2015-02-08 ENCOUNTER — Ambulatory Visit (INDEPENDENT_AMBULATORY_CARE_PROVIDER_SITE_OTHER): Payer: Medicare Other | Admitting: Cardiology

## 2015-02-08 ENCOUNTER — Ambulatory Visit: Payer: No Typology Code available for payment source | Admitting: Pharmacist

## 2015-02-08 ENCOUNTER — Encounter: Payer: Self-pay | Admitting: Cardiology

## 2015-02-08 VITALS — BP 120/70 | HR 57 | Ht 68.0 in | Wt 230.0 lb

## 2015-02-08 DIAGNOSIS — I2510 Atherosclerotic heart disease of native coronary artery without angina pectoris: Secondary | ICD-10-CM | POA: Diagnosis not present

## 2015-02-08 DIAGNOSIS — E7800 Pure hypercholesterolemia, unspecified: Secondary | ICD-10-CM

## 2015-02-08 DIAGNOSIS — I10 Essential (primary) hypertension: Secondary | ICD-10-CM

## 2015-02-08 DIAGNOSIS — I6529 Occlusion and stenosis of unspecified carotid artery: Secondary | ICD-10-CM

## 2015-02-08 DIAGNOSIS — R0602 Shortness of breath: Secondary | ICD-10-CM

## 2015-02-08 MED ORDER — ROSUVASTATIN CALCIUM 40 MG PO TABS: 40.0000 mg | ORAL_TABLET | Freq: Every day | ORAL | Status: DC

## 2015-02-08 MED ORDER — METOPROLOL TARTRATE 25 MG PO TABS: 12.5000 mg | ORAL_TABLET | Freq: Two times a day (BID) | ORAL | Status: DC

## 2015-02-08 MED ORDER — ASPIRIN 81 MG PO TABS: 81.0000 mg | ORAL_TABLET | Freq: Every day | ORAL | Status: DC

## 2015-02-08 NOTE — Patient Instructions (Signed)
Medication Instructions:  Your physician has recommended you make the following change in your medication: 1) DECREASE ASPIRIN to 81mg  daily  Labwork: None  Testing/Procedures: Your physician has requested that you have a carotid duplex. This test is an ultrasound of the carotid arteries in your neck. It looks at blood flow through these arteries that supply the brain with blood. Allow one hour for this exam. There are no restrictions or special instructions.  Dr. Radford Pax recommends you have an [place].  Follow-Up: Your physician recommends that you schedule a follow-up appointment with [facility].  Your physician wants you to follow-up in: 1 year with Dr. Radford Pax. You will receive a reminder letter in the mail two months in advance. If you don't receive a letter, please call our office to schedule the follow-up appointment.   Any Other Special Instructions Will Be Listed Below (If Applicable).

## 2015-02-08 NOTE — Progress Notes (Signed)
Cardiology Office Note   Date:  02/08/2015   ID:  [name] [name], DOB 09-09-1950, MRN [id number]  PCP:  Donnie Coffin, MD    Chief Complaint  Patient presents with  . Follow-up    CAD     History of Present Illness:  [name] [name] is a 64 y.o. male with a history of ASCAD, HTN and dyslipidemia. He is doing well. He denies any anginal chest pain, dizziness, palpitations or syncope.  He has had some SOB with DOE that he thinks is due to deconditioning from lack of exercise when he tries to do something rigorous.  He can usually work through it and go on with his activities.  He rarely will have some edema in his ankles at the end of the day.   Past Medical History  Diagnosis Date  . Dyslipidemia   . Bilateral carotid artery stenosis     Mild  . Melanoma 2010    of the scalp Dr Wendee Beavers Harvel Quale  . Depression     Dr Charissa Bash  . Hypertension   . Coronary artery disease     s/pCABG    Past Surgical History  Procedure Laterality Date  . Coronary artery bypass graft       Current Outpatient Prescriptions  Medication Sig Dispense Refill  . Acetaminophen (TYLENOL EXTRA STRENGTH PO) Take 500 mg by mouth as needed.    Marland Kitchen aspirin 325 MG tablet Take 325 mg by mouth daily.    . Calcium Carb-Cholecalciferol (CALCIUM 500 +D PO) Take 1 tablet by mouth daily.    Marland Kitchen ezetimibe (ZETIA) 10 MG tablet Take 10 mg by mouth daily.    . famotidine (PEPCID) 20 MG tablet Take 20 mg by mouth 2 (two) times daily.    Marland Kitchen FLUoxetine (PROZAC) 20 MG capsule Take 20 mg by mouth daily.    . fluticasone (FLONASE) 50 MCG/ACT nasal spray Place 1 spray into both nostrils as needed for allergies or rhinitis.    . IBUPROFEN PO Take 250 mg by mouth as needed (for pain).    Marland Kitchen loratadine (CLARITIN) 10 MG tablet Take 10 mg by mouth daily.    . metoprolol tartrate (LOPRESSOR) 25 MG tablet TAKE 0.5 TABLETS (12.5 MG TOTAL) BY MOUTH 2 (TWO) TIMES DAILY. 60 tablet 0  . Multiple Vitamin  (MULTIVITAMIN) tablet Take 1 tablet by mouth daily.    . Omega-3 Fatty Acids (FISH OIL) 1000 MG CAPS Take 1 capsule by mouth 2 (two) times daily.    . rosuvastatin (CRESTOR) 40 MG tablet Take 1 tablet (40 mg total) by mouth daily. 35 tablet 0  . vitamin C (ASCORBIC ACID) 500 MG tablet Take 500 mg by mouth daily.     No current facility-administered medications for this visit.    Allergies:   Penicillins    Social History:  The patient  reports that he quit smoking about 26 years ago. He does not have any smokeless tobacco history on file. He reports that he drinks alcohol. He reports that he does not use illicit drugs.   Family History:  The patient's family history includes Heart disease in his father.    ROS:  Please see the history of present illness.   Otherwise, review of systems are positive for muscle pain.   All other systems are reviewed and negative.    PHYSICAL EXAM: VS:  BP 120/70 mmHg  Pulse 57  Ht 5\' 8"  (1.727 m)  Wt 230 lb (104.327 kg)  BMI 34.98 kg/m2  SpO2 97% , BMI Body mass index is 34.98 kg/(m^2). GEN: Well nourished, well developed, in no acute distress HEENT: normal Neck: no JVD, carotid bruits, or masses Cardiac: RRR; no murmurs, rubs, or gallops,no edema  Respiratory:  clear to auscultation bilaterally, normal work of breathing GI: soft, nontender, nondistended, + BS MS: no deformity or atrophy Skin: warm and dry, no rash Neuro:  Strength and sensation are intact Psych: euthymic mood, full affect   EKG:  EKG is not ordered today.    Recent Labs: 01/12/2015: ALT 25    Lipid Panel    Component Value Date/Time   CHOL 173 01/12/2015 1219   TRIG 167.0* 01/12/2015 1219   HDL 39.20 01/12/2015 1219   CHOLHDL 4 01/12/2015 1219   VLDL 33.4 01/12/2015 1219   LDLCALC 100* 01/12/2015 1219      Wt Readings from Last 3 Encounters:  02/08/15 230 lb (104.327 kg)  08/18/13 225 lb (102.059 kg)  08/04/13 223 lb 12.8 oz (101.515 kg)     ASSESSMENT AND  PLAN:  1. ASCAD with no angina but is having some DOE.  I will get a stress myoview to rule out ischemia.   - continue ASA and decrease to 81mg  daily 2. HTN - well controlled - continue metoprolol 3. Dyslipidemia - last LDL not at goal and he was referred to lipid clinic but has not been seen - continue fish oil and crestor.  He stopped the zetia due to cost.   - refer to lipid clinic - I encouraged him to start exercising more   4.  DOE which I suspect is due to deconditioning and obesity but will get a nuclear stress test to rule out ischemia.     5.  Mild carotid artery stenosis - it has been 10 years since evaluation at time of CABG.  I will repeat carotid dopplers   Current medicines are reviewed at length with the patient today.  The patient does not have concerns regarding medicines.  The following changes have been made:  no change  Labs/ tests ordered today: See above Assessment and Plan No orders of the defined types were placed in this encounter.     Disposition:   FU with me in 6 months  Signed, Sueanne Margarita, MD  02/08/2015 1:36 PM    [facility] [address], [place], [place]  [address] Phone: [phone]; Fax: [phone]

## 2015-02-08 NOTE — Addendum Note (Signed)
Addended by: Harland German A on: 02/08/2015 02:04 PM   Modules accepted: Orders

## 2015-03-19 ENCOUNTER — Ambulatory Visit (HOSPITAL_COMMUNITY)
Admission: RE | Admit: 2015-03-19 | Discharge: 2015-03-19 | Disposition: A | Discharge status: Home / Self Care | Payer: Medicare Other | Source: Ambulatory Visit | Attending: Cardiovascular Disease | Admitting: Cardiovascular Disease

## 2015-03-19 ENCOUNTER — Ambulatory Visit (INDEPENDENT_AMBULATORY_CARE_PROVIDER_SITE_OTHER): Payer: Medicare Other | Admitting: Pharmacist

## 2015-03-19 DIAGNOSIS — I6523 Occlusion and stenosis of bilateral carotid arteries: Secondary | ICD-10-CM | POA: Insufficient documentation

## 2015-03-19 DIAGNOSIS — E7800 Pure hypercholesterolemia, unspecified: Secondary | ICD-10-CM

## 2015-03-19 DIAGNOSIS — I6529 Occlusion and stenosis of unspecified carotid artery: Secondary | ICD-10-CM

## 2015-03-19 MED ORDER — METOPROLOL SUCCINATE ER 25 MG PO TB24: 25.0000 mg | ORAL_TABLET | Freq: Every day | ORAL | Status: DC

## 2015-03-19 MED ORDER — EZETIMIBE 10 MG PO TABS: 10.0000 mg | ORAL_TABLET | Freq: Every day | ORAL | Status: DC

## 2015-03-19 MED ORDER — ROSUVASTATIN CALCIUM 40 MG PO TABS: 40.0000 mg | ORAL_TABLET | Freq: Every day | ORAL | Status: DC

## 2015-03-19 NOTE — Patient Instructions (Signed)
Continue your current medications.   I will send in a prescription for Zetia for the pharmacy to place on hold.  After the first of the year it should be generic and a cheaper co pay.    We will change your metoprolol to a once a day formulation   Try to be active while watching TV  Change out diet soft drinks for water while driving.    We will recheck your labs again when you see Dr. Radford Pax

## 2015-03-19 NOTE — Progress Notes (Signed)
HPI: [name] [name] is a 64 y.o. male patient referred to lipid clinic by Dr. Radford Pax.  He has a history of ASCVD s/p CABG, HTN and hyperlipidemia.  He is currently taking Crestor 40mg  daily.  He states he has taken this for many years with no problems.  He was also taking Zetia but stopped ~1 1/2 years ago due to cost.  He has not investigated the cost with his Part D plan.    Current Medications: Crestor 40mg  daily Intolerances: Lipitor- pt unsure if he stopped due to myalgias or cost, Zetia- stopped due to cost Risk Factors: CAD s/p CAGB  LDL goal: <70 mg/dL  Diet: Pt has a diet of a typical truck driver.  He has a biscuit with 1-2 eggs a day for breakfast.  He will carry 2 sandwiches (chicken, ham, or roast beef) with him one the road.  He will snack on a black bean ans salsa mix with chips or PB and crackers.  He eats 2 sweets a day but tries to keep each serving < 400 calories each.  He drinks ~3 caffeine free diet drinks per day and the same amount of sweet tea.  When he does not carry his food with him, he is usually getting fast food or things from the gas stations.   Exercise: Pt does not do any regular exercise.  He does have a bike he would ride in the past  Social History:  Pt works as a Administrator.  He lives by himself and is usually on the road at least 5 days of the week.   Labs: 12/2014: TC 173, TG 167, HDL 39, LDL 100, ALT normal (Crestor 40mg  daily)  Current Outpatient Prescriptions  Medication Sig Dispense Refill  . Acetaminophen (TYLENOL EXTRA STRENGTH PO) Take 500 mg by mouth as needed.    Marland Kitchen aspirin 81 MG tablet Take 1 tablet (81 mg total) by mouth daily.    . Calcium Carb-Cholecalciferol (CALCIUM 500 +D PO) Take 1 tablet by mouth daily.    . famotidine (PEPCID) 20 MG tablet Take 20 mg by mouth 2 (two) times daily.    . fluticasone (FLONASE) 50 MCG/ACT nasal spray Place 1 spray into both nostrils as needed for allergies or rhinitis.    . IBUPROFEN PO Take 250  mg by mouth as needed (for pain).    Marland Kitchen loratadine (CLARITIN) 10 MG tablet Take 10 mg by mouth daily.    . metoprolol tartrate (LOPRESSOR) 25 MG tablet Take 0.5 tablets (12.5 mg total) by mouth 2 (two) times daily. 90 tablet 3  . Multiple Vitamin (MULTIVITAMIN) tablet Take 1 tablet by mouth daily.    . Omega-3 Fatty Acids (FISH OIL) 1000 MG CAPS Take 1 capsule by mouth 2 (two) times daily.    . rosuvastatin (CRESTOR) 40 MG tablet Take 1 tablet (40 mg total) by mouth daily. 30 tablet 0  . vitamin C (ASCORBIC ACID) 500 MG tablet Take 500 mg by mouth daily.     No current facility-administered medications for this visit.     Allergies  Allergen Reactions  . Penicillins Rash    Assessment/Plan:  1.  Hyperlipidemia-  Pt on max dose Crestor but LDL still slightly above goal of <70 mg/dL.  Pt's LDL was close to goal on Crestor/Zetia 2 years ago.  Reviewed his Part D formulary and Zetia is still a tier 4 medication and cost prohibitive.  This will go generic in November.  Will  send him in an Rx and once it goes generic, he can hopefully start.  Will plan on rechecking labs next year with Dr. Radford Pax as do not anticipate him changing therapy for several months.   2.  Hypertension- Pt's BP controlled but he admitted to having difficulty remembering to take the 2nd dose on some occassions.  Will change to metoprolol succinate once daily so compliance can be improved.

## 2015-03-20 ENCOUNTER — Encounter (HOSPITAL_COMMUNITY): Payer: Self-pay | Admitting: Emergency Medicine

## 2015-03-20 ENCOUNTER — Emergency Department (HOSPITAL_COMMUNITY): Payer: Medicare Other

## 2015-03-20 ENCOUNTER — Emergency Department (HOSPITAL_COMMUNITY)
Admission: EM | Admit: 2015-03-20 | Discharge: 2015-03-20 | Disposition: A | Discharge status: Home / Self Care | Payer: Medicare Other | Attending: Emergency Medicine | Admitting: Emergency Medicine

## 2015-03-20 ENCOUNTER — Encounter: Payer: Self-pay | Admitting: Cardiology

## 2015-03-20 DIAGNOSIS — I6529 Occlusion and stenosis of unspecified carotid artery: Secondary | ICD-10-CM | POA: Insufficient documentation

## 2015-03-20 DIAGNOSIS — Y998 Other external cause status: Secondary | ICD-10-CM | POA: Insufficient documentation

## 2015-03-20 DIAGNOSIS — S99912A Unspecified injury of left ankle, initial encounter: Secondary | ICD-10-CM | POA: Diagnosis present

## 2015-03-20 DIAGNOSIS — Z79899 Other long term (current) drug therapy: Secondary | ICD-10-CM | POA: Diagnosis not present

## 2015-03-20 DIAGNOSIS — F329 Major depressive disorder, single episode, unspecified: Secondary | ICD-10-CM | POA: Insufficient documentation

## 2015-03-20 DIAGNOSIS — I10 Essential (primary) hypertension: Secondary | ICD-10-CM | POA: Insufficient documentation

## 2015-03-20 DIAGNOSIS — S82832A Other fracture of upper and lower end of left fibula, initial encounter for closed fracture: Secondary | ICD-10-CM | POA: Diagnosis not present

## 2015-03-20 DIAGNOSIS — Y9389 Activity, other specified: Secondary | ICD-10-CM | POA: Insufficient documentation

## 2015-03-20 DIAGNOSIS — Y9289 Other specified places as the place of occurrence of the external cause: Secondary | ICD-10-CM | POA: Diagnosis not present

## 2015-03-20 DIAGNOSIS — E785 Hyperlipidemia, unspecified: Secondary | ICD-10-CM | POA: Diagnosis not present

## 2015-03-20 DIAGNOSIS — W01198A Fall on same level from slipping, tripping and stumbling with subsequent striking against other object, initial encounter: Secondary | ICD-10-CM | POA: Diagnosis not present

## 2015-03-20 DIAGNOSIS — Z87891 Personal history of nicotine dependence: Secondary | ICD-10-CM | POA: Insufficient documentation

## 2015-03-20 DIAGNOSIS — Z880 Allergy status to penicillin: Secondary | ICD-10-CM | POA: Diagnosis not present

## 2015-03-20 DIAGNOSIS — Z7982 Long term (current) use of aspirin: Secondary | ICD-10-CM | POA: Diagnosis not present

## 2015-03-20 DIAGNOSIS — S82852A Displaced trimalleolar fracture of left lower leg, initial encounter for closed fracture: Secondary | ICD-10-CM | POA: Insufficient documentation

## 2015-03-20 DIAGNOSIS — M25579 Pain in unspecified ankle and joints of unspecified foot: Secondary | ICD-10-CM | POA: Diagnosis not present

## 2015-03-20 DIAGNOSIS — T148 Other injury of unspecified body region: Secondary | ICD-10-CM | POA: Diagnosis not present

## 2015-03-20 DIAGNOSIS — I2510 Atherosclerotic heart disease of native coronary artery without angina pectoris: Secondary | ICD-10-CM | POA: Insufficient documentation

## 2015-03-20 MED ORDER — OXYCODONE-ACETAMINOPHEN 5-325 MG PO TABS: 1.0000 | ORAL_TABLET | Freq: Four times a day (QID) | ORAL | Status: DC | PRN

## 2015-03-20 MED ORDER — MORPHINE SULFATE 4 MG/ML IJ SOLN
4.0000 mg | Freq: Once | INTRAMUSCULAR | Status: AC
  Administered 2015-03-20: 4 mg via INTRAMUSCULAR
  Filled 2015-03-20: qty 1

## 2015-03-20 MED ORDER — MORPHINE SULFATE 4 MG/ML IJ SOLN: 4.0000 mg | Freq: Once | INTRAMUSCULAR | Status: DC

## 2015-03-20 NOTE — Progress Notes (Signed)
Orthopedic Tech Progress Note Patient Details:  [name] [name] 1950/10/20 [id number]  Ortho Devices Type of Ortho Device: Ace wrap, Crutches, Post (short leg) splint, Stirrup splint Ortho Device/Splint Location: lle Ortho Device/Splint Interventions: Application   [name] 03/20/2015, 1:07 PM

## 2015-03-20 NOTE — Discharge Instructions (Signed)
Trimalleolar Fracture, Ankle, Adult, Displaced (ORIF), Care After Please read the instructions outlined below. Refer to these instructions for the next few weeks. These discharge instructions provide you with general information on caring for yourself after surgery. Your surgeon may also give you specific instructions. While your treatment has been planned according to the most current medical practices available, unavoidable complications occasionally occur. If you have any problems or questions after discharge, call your surgeon. HOME CARE INSTRUCTIONS   You may resume normal diet and activities as directed or allowed.  Keep ice packs (a bag of ice wrapped in a towel) on the surgical area for 15-20 minutes, 03-04 times per day, for the first two days following surgery. Use the ice only if okay with your surgeon or caregiver.  Change bandages (dressings) if necessary or as directed.  If you have a plaster or fiberglass cast:  Do not scratch the skin under the cast using sharp or pointed objects.  Check the skin around the cast every day. You may put lotion on any red or sore areas.  Keep your cast dry and clean.  Do not put pressure on any part of your cast or splint until it is fully hardened.  Your cast or splint can be protected during bathing with a plastic bag. Do not lower the cast or splint into water.  Only take over-the-counter or prescription medicines for pain, discomfort, or fever as directed by your caregiver.  Use crutches as directed. Do not exercise your leg unless instructed.  Keep appointments as directed. These are not fractures to be taken lightly! If these bones become displaced and get out of position, it may eventually lead to arthritis and disability for the rest of your life. Problems often follow even the best of care. Follow the directions of your surgeon. SEEK IMMEDIATE MEDICAL CARE IF:   You develop redness, swelling, or increasing pain in the wound.  You  develop yellowish white fluid (pus) coming from wound.  An unexplained oral temperature above 102 F (38.9 C) develops.  You develop a bad smell coming from the wound or dressings.  Your wound breaks open (edges not staying together) after sutures or staples have been removed. If you do not have a window in your cast for observing the wound, a discharge or minor bleeding may show up as a stain on the outside of your cast. Report these findings to your surgeon. Document Released: 02/17/2005 Document Revised: 08/05/2013 Document Reviewed: 09/18/2013 [facility] Patient Information 2015 [place], [place]. This information is not intended to replace advice given to you by your health care provider. Make sure you discuss any questions you have with your health care provider.

## 2015-03-20 NOTE — ED Provider Notes (Signed)
CSN: [id number]     Arrival date & time 03/20/15  1102 History   First MD Initiated Contact with Patient 03/20/15 1103     Chief Complaint  Patient presents with  . Ankle Injury     (Consider location/radiation/quality/duration/timing/severity/associated sxs/prior Treatment) HPI Comments: Pt comes in today with left ankle pain after a fall today. He states that he was walking up a ramp and lost his footing. Denies numbness or weakness but states that he was having a hard time controlling his ankle. He states that his foot went up under him. No loc or dizziness associated with the fall. No prior injury to the area.  The history is provided by the patient. No language interpreter was used.    Past Medical History  Diagnosis Date  . Dyslipidemia   . Bilateral carotid artery stenosis     Mild  . Melanoma 2010    of the scalp Dr Wendee Beavers Harvel Quale  . Depression     Dr Charissa Bash  . Hypertension   . Coronary artery disease     s/pCABG   Past Surgical History  Procedure Laterality Date  . Coronary artery bypass graft     Family History  Problem Relation Age of Onset  . Heart disease Father    History  Substance Use Topics  . Smoking status: Former Smoker    Quit date: 08/14/1988  . Smokeless tobacco: Not on file  . Alcohol Use: Yes     Comment: 5 per week    Review of Systems  All other systems reviewed and are negative.     Allergies  Penicillins  Home Medications   Prior to Admission medications   Medication Sig Start Date End Date Taking? Authorizing Provider  Acetaminophen (TYLENOL EXTRA STRENGTH PO) Take 500 mg by mouth as needed.    Historical Provider, MD  aspirin 81 MG tablet Take 1 tablet (81 mg total) by mouth daily. 02/08/15   Sueanne Margarita, MD  Calcium Carb-Cholecalciferol (CALCIUM 500 +D PO) Take 1 tablet by mouth daily.    Historical Provider, MD  ezetimibe (ZETIA) 10 MG tablet Take 1 tablet (10 mg total) by mouth daily. 03/19/15   Sueanne Margarita, MD   famotidine (PEPCID) 20 MG tablet Take 20 mg by mouth 2 (two) times daily.    Historical Provider, MD  fluticasone (FLONASE) 50 MCG/ACT nasal spray Place 1 spray into both nostrils as needed for allergies or rhinitis.    Historical Provider, MD  IBUPROFEN PO Take 250 mg by mouth as needed (for pain).    Historical Provider, MD  loratadine (CLARITIN) 10 MG tablet Take 10 mg by mouth daily.    Historical Provider, MD  metoprolol succinate (TOPROL XL) 25 MG 24 hr tablet Take 1 tablet (25 mg total) by mouth daily. 03/19/15   Sueanne Margarita, MD  Multiple Vitamin (MULTIVITAMIN) tablet Take 1 tablet by mouth daily.    Historical Provider, MD  Omega-3 Fatty Acids (FISH OIL) 1000 MG CAPS Take 1 capsule by mouth 2 (two) times daily.    Historical Provider, MD  rosuvastatin (CRESTOR) 40 MG tablet Take 1 tablet (40 mg total) by mouth daily. 03/19/15   Sueanne Margarita, MD  vitamin C (ASCORBIC ACID) 500 MG tablet Take 500 mg by mouth daily.    Historical Provider, MD   There were no vitals taken for this visit. Physical Exam  Constitutional: He is oriented to person, place, and time. He appears well-developed and well-nourished.  Cardiovascular:  Normal rate and regular rhythm.   Pulmonary/Chest: Effort normal and breath sounds normal.  Abdominal: Soft. Bowel sounds are normal.  Musculoskeletal:       Cervical back: Normal.       Thoracic back: Normal.       Lumbar back: Normal.  Obvious swelling to ankle. Pulses intact  Neurological: He is alert and oriented to person, place, and time. He exhibits normal muscle tone. Coordination normal.  Skin: Skin is warm and dry.  Psychiatric: He has a normal mood and affect.  Nursing note and vitals reviewed.   ED Course  Procedures (including critical care time) Labs Review Labs Reviewed - No data to display  Imaging Review Dg Ankle Complete Left  03/20/2015   CLINICAL DATA:  Patient slipped on a ramp and left foot went under ramp, left ankle swollen and bruised,  left ankle pain  EXAM: LEFT ANKLE COMPLETE - 3+ VIEW  COMPARISON:  None.  FINDINGS: Mildly displaced oblique fracture distal fibula. Moderately displaced fracture medial malleolus. Mortise appears widened medially. Mildly displaced fracture posterior malleolus. Significant soft tissue swelling. Ankle joint effusion.  IMPRESSION: Trimalleolar fracture   Electronically Signed   By: Skipper Cliche M.D.   On: 03/20/2015 11:50   Dg Foot Complete Left  03/20/2015   CLINICAL DATA:  Patient slipped and fell, left ankle and foot pain  EXAM: LEFT FOOT - COMPLETE 3+ VIEW  COMPARISON:  Ankle radiographs  FINDINGS: Known trimalleolar ankle fracture. No abnormalities involving the foot.  IMPRESSION: Negative   Electronically Signed   By: Skipper Cliche M.D.   On: 03/20/2015 11:55     EKG Interpretation None      MDM   Final diagnoses:  Trimalleolar fracture of ankle, closed, left, initial encounter    Pt given care instruction. Neurovascularly intact. Spoke with Dr. Lorin Mercy who will follow up in the office. Given script for percocet.     Glendell Docker, NP 03/20/15 [place], MD 03/20/15 [phone]

## 2015-03-20 NOTE — ED Notes (Signed)
Pt is in stable condition upon d/c and is escorted from ED via wheelchair. 

## 2015-03-20 NOTE — ED Notes (Signed)
Patient transported to X-ray via stretcher 

## 2015-03-20 NOTE — ED Notes (Signed)
Patient presents to ED s/p fall.  Patient states he slipped on a ramp and his left foot went under the ramp.  Left ankle swollen and bruised.

## 2015-03-22 ENCOUNTER — Other Ambulatory Visit (HOSPITAL_COMMUNITY): Payer: Self-pay | Admitting: Orthopaedic Surgery

## 2015-03-22 ENCOUNTER — Encounter (HOSPITAL_COMMUNITY): Payer: Medicare Other

## 2015-03-23 ENCOUNTER — Ambulatory Visit (HOSPITAL_COMMUNITY): Payer: Medicare Other | Admitting: Certified Registered"

## 2015-03-23 ENCOUNTER — Encounter (HOSPITAL_COMMUNITY): Payer: Self-pay | Admitting: *Deleted

## 2015-03-23 ENCOUNTER — Telehealth: Payer: Self-pay

## 2015-03-23 ENCOUNTER — Ambulatory Visit (HOSPITAL_COMMUNITY): Payer: Medicare Other

## 2015-03-23 ENCOUNTER — Observation Stay (HOSPITAL_COMMUNITY)
Admission: RE | Admit: 2015-03-23 | Discharge: 2015-03-25 | Disposition: A | Discharge status: Home / Self Care | Payer: Medicare Other | Source: Ambulatory Visit | Attending: Orthopaedic Surgery | Admitting: Orthopaedic Surgery

## 2015-03-23 ENCOUNTER — Encounter (HOSPITAL_COMMUNITY)
Admission: RE | Disposition: A | Discharge status: Home / Self Care | Payer: Self-pay | Source: Ambulatory Visit | Attending: Orthopaedic Surgery

## 2015-03-23 DIAGNOSIS — M25572 Pain in left ankle and joints of left foot: Secondary | ICD-10-CM | POA: Insufficient documentation

## 2015-03-23 DIAGNOSIS — I10 Essential (primary) hypertension: Secondary | ICD-10-CM | POA: Insufficient documentation

## 2015-03-23 DIAGNOSIS — W010XXA Fall on same level from slipping, tripping and stumbling without subsequent striking against object, initial encounter: Secondary | ICD-10-CM | POA: Diagnosis not present

## 2015-03-23 DIAGNOSIS — R29898 Other symptoms and signs involving the musculoskeletal system: Secondary | ICD-10-CM | POA: Insufficient documentation

## 2015-03-23 DIAGNOSIS — Z5181 Encounter for therapeutic drug level monitoring: Secondary | ICD-10-CM | POA: Insufficient documentation

## 2015-03-23 DIAGNOSIS — I2510 Atherosclerotic heart disease of native coronary artery without angina pectoris: Secondary | ICD-10-CM | POA: Insufficient documentation

## 2015-03-23 DIAGNOSIS — I739 Peripheral vascular disease, unspecified: Secondary | ICD-10-CM | POA: Diagnosis not present

## 2015-03-23 DIAGNOSIS — E785 Hyperlipidemia, unspecified: Secondary | ICD-10-CM | POA: Insufficient documentation

## 2015-03-23 DIAGNOSIS — Z880 Allergy status to penicillin: Secondary | ICD-10-CM | POA: Insufficient documentation

## 2015-03-23 DIAGNOSIS — I6523 Occlusion and stenosis of bilateral carotid arteries: Secondary | ICD-10-CM | POA: Diagnosis not present

## 2015-03-23 DIAGNOSIS — S82853A Displaced trimalleolar fracture of unspecified lower leg, initial encounter for closed fracture: Secondary | ICD-10-CM | POA: Diagnosis not present

## 2015-03-23 DIAGNOSIS — Z951 Presence of aortocoronary bypass graft: Secondary | ICD-10-CM | POA: Diagnosis not present

## 2015-03-23 DIAGNOSIS — S8262XD Displaced fracture of lateral malleolus of left fibula, subsequent encounter for closed fracture with routine healing: Secondary | ICD-10-CM | POA: Diagnosis not present

## 2015-03-23 DIAGNOSIS — Z79899 Other long term (current) drug therapy: Secondary | ICD-10-CM | POA: Insufficient documentation

## 2015-03-23 DIAGNOSIS — Z85820 Personal history of malignant melanoma of skin: Secondary | ICD-10-CM | POA: Insufficient documentation

## 2015-03-23 DIAGNOSIS — F329 Major depressive disorder, single episode, unspecified: Secondary | ICD-10-CM | POA: Diagnosis not present

## 2015-03-23 DIAGNOSIS — Z419 Encounter for procedure for purposes other than remedying health state, unspecified: Secondary | ICD-10-CM

## 2015-03-23 DIAGNOSIS — Z87891 Personal history of nicotine dependence: Secondary | ICD-10-CM | POA: Insufficient documentation

## 2015-03-23 DIAGNOSIS — S82852A Displaced trimalleolar fracture of left lower leg, initial encounter for closed fracture: Principal | ICD-10-CM | POA: Insufficient documentation

## 2015-03-23 DIAGNOSIS — S8252XD Displaced fracture of medial malleolus of left tibia, subsequent encounter for closed fracture with routine healing: Secondary | ICD-10-CM | POA: Diagnosis not present

## 2015-03-23 DIAGNOSIS — S82899A Other fracture of unspecified lower leg, initial encounter for closed fracture: Secondary | ICD-10-CM | POA: Diagnosis present

## 2015-03-23 HISTORY — PX: ORIF ANKLE FRACTURE: SHX5408

## 2015-03-23 LAB — COMPREHENSIVE METABOLIC PANEL
ALT: 19 U/L (ref 17–63)
AST: 23 U/L (ref 15–41)
Albumin: 3.7 g/dL (ref 3.5–5.0)
Alkaline Phosphatase: 34 U/L — ABNORMAL LOW (ref 38–126)
Anion gap: 9 (ref 5–15)
BUN: 14 mg/dL (ref 6–20)
CALCIUM: 9.4 mg/dL (ref 8.9–10.3)
CO2: 25 mmol/L (ref 22–32)
Chloride: 107 mmol/L (ref 101–111)
Creatinine, Ser: 1.11 mg/dL (ref 0.61–1.24)
GFR calc non Af Amer: >60 mL/min (ref >60)
Glucose, Bld: 92 mg/dL (ref 65–99)
Potassium: 4.1 mmol/L (ref 3.5–5.1)
SODIUM: 141 mmol/L (ref 135–145)
Total Bilirubin: 0.9 mg/dL (ref 0.3–1.2)
Total Protein: 7.4 g/dL (ref 6.5–8.1)

## 2015-03-23 LAB — URINALYSIS, ROUTINE W REFLEX MICROSCOPIC
Bilirubin Urine: NEGATIVE
Glucose, UA: NEGATIVE mg/dL
HGB URINE DIPSTICK: NEGATIVE
KETONES UR: NEGATIVE mg/dL
LEUKOCYTES UA: NEGATIVE
Nitrite: NEGATIVE
PH: 5 (ref 5.0–8.0)
Protein, ur: NEGATIVE mg/dL
Specific Gravity, Urine: 1.02 (ref 1.005–1.030)
UROBILINOGEN UA: 0.2 mg/dL (ref 0.0–1.0)

## 2015-03-23 LAB — CBC
HCT: 38.9 % — ABNORMAL LOW (ref 39.0–52.0)
Hemoglobin: 13.3 g/dL (ref 13.0–17.0)
MCH: 31.9 pg (ref 26.0–34.0)
MCHC: 34.2 g/dL (ref 30.0–36.0)
MCV: 93.3 fL (ref 78.0–100.0)
Platelets: 248 10*3/uL (ref 150–400)
RBC: 4.17 MIL/uL — AB (ref 4.22–5.81)
RDW: 13.8 % (ref 11.5–15.5)
WBC: 7.5 10*3/uL (ref 4.0–10.5)

## 2015-03-23 LAB — PROTIME-INR
INR: 1.03 (ref 0.00–1.49)
PROTHROMBIN TIME: 13.7 s (ref 11.6–15.2)

## 2015-03-23 SURGERY — OPEN REDUCTION INTERNAL FIXATION (ORIF) ANKLE FRACTURE
Anesthesia: General | Site: Ankle | Laterality: Left

## 2015-03-23 MED ORDER — METOCLOPRAMIDE HCL 5 MG/ML IJ SOLN
5.0000 mg | Freq: Three times a day (TID) | INTRAMUSCULAR | Status: DC | PRN
Start: 2015-03-23 — End: 2015-03-25

## 2015-03-23 MED ORDER — LIDOCAINE HCL (CARDIAC) 20 MG/ML IV SOLN
INTRAVENOUS | Status: DC | PRN
  Administered 2015-03-23: 100 mg via INTRAVENOUS

## 2015-03-23 MED ORDER — HYDROMORPHONE HCL 1 MG/ML IJ SOLN: 0.2500 mg | INTRAMUSCULAR | Status: DC | PRN

## 2015-03-23 MED ORDER — FENTANYL CITRATE (PF) 100 MCG/2ML IJ SOLN
INTRAMUSCULAR | Status: DC | PRN
  Administered 2015-03-23: 50 ug via INTRAVENOUS
  Administered 2015-03-23: 100 ug via INTRAVENOUS
  Administered 2015-03-23: 50 ug via INTRAVENOUS

## 2015-03-23 MED ORDER — DOCUSATE SODIUM 100 MG PO CAPS
100.0000 mg | ORAL_CAPSULE | Freq: Two times a day (BID) | ORAL | Status: DC
  Administered 2015-03-23 – 2015-03-25 (×4): 100 mg via ORAL

## 2015-03-23 MED ORDER — ACETAMINOPHEN 325 MG PO TABS: 650.0000 mg | ORAL_TABLET | Freq: Four times a day (QID) | ORAL | Status: DC | PRN

## 2015-03-23 MED ORDER — HYDROMORPHONE HCL 1 MG/ML IJ SOLN
0.5000 mg | INTRAMUSCULAR | Status: DC | PRN
  Administered 2015-03-23: 0.5 mg via INTRAVENOUS

## 2015-03-23 MED ORDER — ROSUVASTATIN CALCIUM 40 MG PO TABS
40.0000 mg | ORAL_TABLET | Freq: Every day | ORAL | Status: DC
  Administered 2015-03-24 – 2015-03-25 (×2): 40 mg via ORAL
  Filled 2015-03-23 (×2): qty 1

## 2015-03-23 MED ORDER — ASPIRIN 81 MG PO CHEW
81.0000 mg | CHEWABLE_TABLET | Freq: Every day | ORAL | Status: DC
  Administered 2015-03-24 – 2015-03-25 (×2): 81 mg via ORAL
  Filled 2015-03-23 (×2): qty 1

## 2015-03-23 MED ORDER — MIDAZOLAM HCL 2 MG/2ML IJ SOLN
INTRAMUSCULAR | Status: AC
  Filled 2015-03-23: qty 4

## 2015-03-23 MED ORDER — METHOCARBAMOL 500 MG PO TABS
500.0000 mg | ORAL_TABLET | Freq: Four times a day (QID) | ORAL | Status: DC | PRN
  Administered 2015-03-24 – 2015-03-25 (×4): 500 mg via ORAL
  Filled 2015-03-23 (×4): qty 1

## 2015-03-23 MED ORDER — ENOXAPARIN SODIUM 40 MG/0.4ML ~~LOC~~ SOLN
40.0000 mg | SUBCUTANEOUS | Status: DC
  Administered 2015-03-24 – 2015-03-25 (×2): 40 mg via SUBCUTANEOUS
  Filled 2015-03-23 (×3): qty 0.4

## 2015-03-23 MED ORDER — ROSUVASTATIN CALCIUM 40 MG PO TABS: 40.0000 mg | ORAL_TABLET | Freq: Every day | ORAL | Status: DC

## 2015-03-23 MED ORDER — METOCLOPRAMIDE HCL 10 MG PO TABS
5.0000 mg | ORAL_TABLET | Freq: Three times a day (TID) | ORAL | Status: DC | PRN
Start: 2015-03-23 — End: 2015-03-25

## 2015-03-23 MED ORDER — CALCIUM CARBONATE-VITAMIN D 500-200 MG-UNIT PO TABS
1.0000 | ORAL_TABLET | Freq: Every day | ORAL | Status: DC
  Administered 2015-03-24 – 2015-03-25 (×2): 1 via ORAL
  Filled 2015-03-23 (×3): qty 1

## 2015-03-23 MED ORDER — LACTATED RINGERS IV SOLN
INTRAVENOUS | Status: DC | PRN
  Administered 2015-03-23: 21:00:00
  Administered 2015-03-23: 19:00:00 via INTRAVENOUS

## 2015-03-23 MED ORDER — ACETAMINOPHEN 650 MG RE SUPP: 650.0000 mg | Freq: Four times a day (QID) | RECTAL | Status: DC | PRN

## 2015-03-23 MED ORDER — METHOCARBAMOL 1000 MG/10ML IJ SOLN
500.0000 mg | Freq: Four times a day (QID) | INTRAMUSCULAR | Status: DC | PRN
  Administered 2015-03-23: 500 mg via INTRAVENOUS
  Filled 2015-03-23 (×3): qty 5

## 2015-03-23 MED ORDER — LORATADINE 10 MG PO TABS
10.0000 mg | ORAL_TABLET | Freq: Every day | ORAL | Status: DC
  Administered 2015-03-24 – 2015-03-25 (×2): 10 mg via ORAL
  Filled 2015-03-23 (×2): qty 1

## 2015-03-23 MED ORDER — CLINDAMYCIN PHOSPHATE 900 MG/50ML IV SOLN
900.0000 mg | INTRAVENOUS | Status: AC
  Administered 2015-03-23: 900 mg via INTRAVENOUS

## 2015-03-23 MED ORDER — CHLORHEXIDINE GLUCONATE 4 % EX LIQD: 60.0000 mL | Freq: Once | CUTANEOUS | Status: DC

## 2015-03-23 MED ORDER — FENTANYL CITRATE (PF) 250 MCG/5ML IJ SOLN
INTRAMUSCULAR | Status: AC
  Filled 2015-03-23: qty 25

## 2015-03-23 MED ORDER — ONDANSETRON HCL 4 MG PO TABS: 4.0000 mg | ORAL_TABLET | Freq: Four times a day (QID) | ORAL | Status: DC | PRN

## 2015-03-23 MED ORDER — BUPIVACAINE HCL (PF) 0.5 % IJ SOLN
INTRAMUSCULAR | Status: DC | PRN
  Administered 2015-03-23: 18 mL

## 2015-03-23 MED ORDER — SUCCINYLCHOLINE CHLORIDE 20 MG/ML IJ SOLN
INTRAMUSCULAR | Status: DC | PRN
  Administered 2015-03-23: 100 mg via INTRAVENOUS

## 2015-03-23 MED ORDER — FLUTICASONE PROPIONATE 50 MCG/ACT NA SUSP: 1.0000 | NASAL | Status: DC | PRN

## 2015-03-23 MED ORDER — PROPOFOL 10 MG/ML IV BOLUS
INTRAVENOUS | Status: DC | PRN
  Administered 2015-03-23: 200 mg via INTRAVENOUS

## 2015-03-23 MED ORDER — OXYCODONE-ACETAMINOPHEN 5-325 MG PO TABS
1.0000 | ORAL_TABLET | Freq: Four times a day (QID) | ORAL | Status: DC | PRN
  Administered 2015-03-24 – 2015-03-25 (×5): 2 via ORAL
  Filled 2015-03-23 (×5): qty 2

## 2015-03-23 MED ORDER — EZETIMIBE 10 MG PO TABS
10.0000 mg | ORAL_TABLET | Freq: Every day | ORAL | Status: DC
  Administered 2015-03-23 – 2015-03-25 (×3): 10 mg via ORAL
  Filled 2015-03-23 (×3): qty 1

## 2015-03-23 MED ORDER — FAMOTIDINE 20 MG PO TABS
20.0000 mg | ORAL_TABLET | Freq: Two times a day (BID) | ORAL | Status: DC
  Administered 2015-03-23 – 2015-03-25 (×4): 20 mg via ORAL
  Filled 2015-03-23 (×5): qty 1

## 2015-03-23 MED ORDER — MIDAZOLAM HCL 5 MG/5ML IJ SOLN
INTRAMUSCULAR | Status: DC | PRN
  Administered 2015-03-23: 2 mg via INTRAVENOUS

## 2015-03-23 MED ORDER — ONDANSETRON HCL 4 MG/2ML IJ SOLN
INTRAMUSCULAR | Status: DC | PRN
  Administered 2015-03-23: 4 mg via INTRAVENOUS

## 2015-03-23 MED ORDER — HYDROMORPHONE HCL 1 MG/ML IJ SOLN
INTRAMUSCULAR | Status: AC
  Filled 2015-03-23: qty 1

## 2015-03-23 MED ORDER — ONDANSETRON HCL 4 MG/2ML IJ SOLN: 4.0000 mg | Freq: Four times a day (QID) | INTRAMUSCULAR | Status: DC | PRN

## 2015-03-23 MED ORDER — METOPROLOL TARTRATE 12.5 MG HALF TABLET
12.5000 mg | ORAL_TABLET | Freq: Two times a day (BID) | ORAL | Status: DC
Start: 2015-03-23 — End: 2015-03-25
  Administered 2015-03-24 – 2015-03-25 (×3): 12.5 mg via ORAL
  Filled 2015-03-23 (×5): qty 1

## 2015-03-23 MED ORDER — BUPIVACAINE HCL (PF) 0.5 % IJ SOLN
INTRAMUSCULAR | Status: AC
  Filled 2015-03-23: qty 30

## 2015-03-23 MED ORDER — CALCIUM CARB-CHOLECALCIFEROL 500-400 MG-UNIT PO TABS: 1.0000 | ORAL_TABLET | Freq: Every day | ORAL | Status: DC

## 2015-03-23 MED ORDER — DEXAMETHASONE SODIUM PHOSPHATE 10 MG/ML IJ SOLN
INTRAMUSCULAR | Status: DC | PRN
  Administered 2015-03-23: 10 mg via INTRAVENOUS

## 2015-03-23 MED ORDER — CLINDAMYCIN PHOSPHATE 900 MG/50ML IV SOLN
INTRAVENOUS | Status: AC
Start: 2015-03-23 — End: 2015-03-23
  Filled 2015-03-23: qty 50

## 2015-03-23 SURGICAL SUPPLY — 38 items
BAG ZIPLOCK 12X15 (MISCELLANEOUS) ×3 IMPLANT
BANDAGE ELASTIC 4 VELCRO ST LF (GAUZE/BANDAGES/DRESSINGS) ×3 IMPLANT
BIT DRILL 110X2.5XQCK CNCT (BIT) ×1 IMPLANT
BIT DRILL 2.5 (BIT) ×2
BIT DRL 110X2.5XQCK CNCT (BIT) ×1
CUFF TOURN SGL QUICK 34 (TOURNIQUET CUFF) ×2
CUFF TRNQT CYL 34X4X40X1 (TOURNIQUET CUFF) ×1 IMPLANT
DECANTER SPIKE VIAL GLASS SM (MISCELLANEOUS) ×3 IMPLANT
DRAPE C-ARM 42X120 X-RAY (DRAPES) ×3 IMPLANT
DRAPE U-SHAPE 47X51 STRL (DRAPES) ×3 IMPLANT
DRSG ADAPTIC 3X8 NADH LF (GAUZE/BANDAGES/DRESSINGS) ×3 IMPLANT
DRSG PAD ABDOMINAL 8X10 ST (GAUZE/BANDAGES/DRESSINGS) ×3 IMPLANT
ELECT REM PT RETURN 9FT ADLT (ELECTROSURGICAL) ×3
ELECTRODE REM PT RTRN 9FT ADLT (ELECTROSURGICAL) ×1 IMPLANT
GAUZE SPONGE 4X4 12PLY STRL (GAUZE/BANDAGES/DRESSINGS) ×3 IMPLANT
GAUZE XEROFORM 5X9 LF (GAUZE/BANDAGES/DRESSINGS) ×3 IMPLANT
GLOVE ORTHO TXT STRL SZ7.5 (GLOVE) ×3 IMPLANT
GOWN STRL REUS W/TWL LRG LVL3 (GOWN DISPOSABLE) ×3 IMPLANT
NEEDLE HYPO 22GX1.5 SAFETY (NEEDLE) ×3 IMPLANT
PACK TOTAL JOINT (CUSTOM PROCEDURE TRAY) ×3 IMPLANT
PAD CAST 4YDX4 CTTN HI CHSV (CAST SUPPLIES) ×2 IMPLANT
PADDING CAST ABS 4INX4YD NS (CAST SUPPLIES) ×2
PADDING CAST ABS COTTON 4X4 ST (CAST SUPPLIES) ×1 IMPLANT
PADDING CAST COTTON 4X4 STRL (CAST SUPPLIES) ×4
PLATE 7HOLE 1/3 TUBULAR (Plate) ×3 IMPLANT
POSITIONER SURGICAL ARM (MISCELLANEOUS) ×3 IMPLANT
SCREW CANC PARTIAL THREAD 4.0 (Screw) ×6 IMPLANT
SCREW CANCELLOUS FT 4.0X14 (Screw) ×9 IMPLANT
SCREW CORTICAL 3.5X14 (Screw) ×12 IMPLANT
SCREW CORTICAL 3.5X26 (Screw) ×3 IMPLANT
SPLINT PLASTER CAST XFAST 5X30 (CAST SUPPLIES) ×1 IMPLANT
SPLINT PLASTER XFAST SET 5X30 (CAST SUPPLIES) ×2
SUCTION FRAZIER TIP 10 FR DISP (SUCTIONS) ×3 IMPLANT
SUT ETHILON 4 0 PS 2 18 (SUTURE) ×6 IMPLANT
SUT VIC AB 2-0 CT1 27 (SUTURE) ×2
SUT VIC AB 2-0 CT1 TAPERPNT 27 (SUTURE) ×1 IMPLANT
SYR CONTROL 10ML LL (SYRINGE) ×3 IMPLANT
TOWEL OR 17X26 10 PK STRL BLUE (TOWEL DISPOSABLE) ×3 IMPLANT

## 2015-03-23 NOTE — Transfer of Care (Signed)
Immediate Anesthesia Transfer of Care Note  Patient: [name] [name]  Procedure(s) Performed: Procedure(s): OPEN REDUCTION INTERNAL FIXATION (ORIF) ANKLE FRACTURE (Left)  Patient Location: PACU  Anesthesia Type:General  Level of Consciousness: sedated  Airway & Oxygen Therapy: Patient Spontanous Breathing and Patient connected to face mask oxygen  Post-op Assessment: Report given to RN and Post -op Vital signs reviewed and stable  Post vital signs: Reviewed and stable  Last Vitals:  Filed Vitals:   03/23/15 1610  BP: 132/79  Pulse: 65  Temp: 36.7 C  Resp: 16    Complications: No apparent anesthesia complications

## 2015-03-23 NOTE — Telephone Encounter (Signed)
-----   Message from Sueanne Margarita, MD sent at 03/20/2015  4:05 PM EDT ----- 1-39% bilateral carotid artery stenosis - repeat study in 1 year

## 2015-03-23 NOTE — Brief Op Note (Signed)
03/23/2015  8:28 PM  PATIENT:  [name] [name]  64 y.o. male  PRE-OPERATIVE DIAGNOSIS:  Left Trimalleolar Ankle Fracture  POST-OPERATIVE DIAGNOSIS:  left trimalleolar ankle fracture  PROCEDURE:  Procedure(s): OPEN REDUCTION INTERNAL FIXATION (ORIF) ANKLE FRACTURE (Left)  Bimalleolar fixation of trimalleolar fracture  SURGEON:  Surgeon(s) and Role:    * Marybelle Killings, MD - Primary  PHYSICIAN ASSISTANT:   ASSISTANTS: none   ANESTHESIA:   local and general  EBL:  Total I/O In: 900 [I.V.:900] Out: -   BLOOD ADMINISTERED:none  DRAINS: none   LOCAL MEDICATIONS USED:  MARCAINE     SPECIMEN:  No Specimen  DISPOSITION OF SPECIMEN:  N/A  COUNTS:  YES  TOURNIQUET:   Total Tourniquet Time Documented: Thigh (Right) - 28 minutes Total: Thigh (Right) - 28 minutes   DICTATION: .Other Dictation: Dictation Number [id number]  PLAN OF CARE: Admit for overnight observation  PATIENT DISPOSITION:  PACU - hemodynamically stable.   Delay start of Pharmacological VTE agent (>24hrs) due to surgical blood loss or risk of bleeding: not applicable

## 2015-03-23 NOTE — Anesthesia Postprocedure Evaluation (Signed)
  Anesthesia Post-op Note  Patient: [name] [name]  Procedure(s) Performed: Procedure(s) (LRB): OPEN REDUCTION INTERNAL FIXATION (ORIF) ANKLE FRACTURE (Left)  Patient Location: PACU  Anesthesia Type: General  Level of Consciousness: awake and alert   Airway and Oxygen Therapy: Patient Spontanous Breathing  Post-op Pain: mild  Post-op Assessment: Post-op Vital signs reviewed, Patient's Cardiovascular Status Stable, Respiratory Function Stable, Patent Airway and No signs of Nausea or vomiting  Last Vitals:  Filed Vitals:   03/23/15 2130  BP: 141/87  Pulse: 62  Temp: 36.7 C  Resp: 15    Post-op Vital Signs: stable   Complications: No apparent anesthesia complications

## 2015-03-23 NOTE — H&P (Signed)
[name] [name] is an 64 y.o. male.   Chief Complaint: left ankle fracture HPI:  Patient slipped and fell 20 Mar 2015.  Left foot got caught behind him.  Unable to ambulate after incident.  Went to the ED and xrays showed a left trimalleolar ankle fracture. Splint was applied and presented to our office today.    Past Medical History  Diagnosis Date  . Dyslipidemia   . Bilateral carotid artery stenosis     Mild  . Melanoma 2010    of the scalp Dr Wendee Beavers Harvel Quale  . Depression     Dr Charissa Bash  . Hypertension   . Coronary artery disease     s/pCABG  . Carotid artery stenosis 03/20/2015    1-39% bilateral on dopplers 03/2015    Past Surgical History  Procedure Laterality Date  . Coronary artery bypass graft      Family History  Problem Relation Age of Onset  . Heart disease Father    Social History:  reports that he quit smoking about 26 years ago. He does not have any smokeless tobacco history on file. He reports that he drinks alcohol. He reports that he does not use illicit drugs.  Allergies:  Allergies  Allergen Reactions  . Penicillins Rash    No prescriptions prior to admission    No results found for this or any previous visit (from the past 48 hour(s)). No results found.  Review of Systems  Constitutional: Negative.   HENT: Negative.   Eyes: Negative.   Respiratory: Negative.   Cardiovascular: Negative.   Gastrointestinal: Negative.   Genitourinary: Negative.   Musculoskeletal: Positive for joint pain.  Skin: Negative.   Neurological: Negative.   Endo/Heme/Allergies: Negative.   Psychiatric/Behavioral: Negative.     There were no vitals taken for this visit. Physical Exam  Constitutional: He is oriented to person, place, and time. He appears well-nourished.  HENT:  Head: Normocephalic and atraumatic.  Eyes: EOM are normal. Pupils are equal, round, and reactive to light.  Neck: Normal range of motion.  Respiratory: Effort normal and breath sounds  normal. No respiratory distress.  GI: He exhibits no distension.  Musculoskeletal:  Splint on left LE  Neurological: He is alert and oriented to person, place, and time.  Skin: Skin is warm and dry.  Psychiatric: He has a normal mood and affect.     Assessment/Plan Left trimalleolar ankle fracture Discussed with patient that best treatment option at this point is ORIF.  Surgical procedure along with possible risks/complication and recovery/rehab time discussed.  All questions answered. Surgery scheduled for this evening.    [name] [name] M 03/23/2015, 11:26 AM

## 2015-03-23 NOTE — Telephone Encounter (Signed)
Informed patient of results and verbal understanding expressed.  Carotid doppler ordered to be scheduled in 1 year. Per patient, Crestor Rx refilled. Patient agrees with treatment plan.

## 2015-03-23 NOTE — Anesthesia Procedure Notes (Signed)
Procedure Name: Intubation Date/Time: 03/23/2015 7:23 PM Performed by: Riki Sheer Pre-anesthesia Checklist: Patient identified, Emergency Drugs available, Suction available, Patient being monitored and Timeout performed Patient Re-evaluated:Patient Re-evaluated prior to inductionOxygen Delivery Method: Circle system utilized Preoxygenation: Pre-oxygenation with 100% oxygen Intubation Type: IV induction Laryngoscope Size: Miller and 2 Grade View: Grade I Tube type: Oral Number of attempts: 1 Airway Equipment and Method: Stylet Placement Confirmation: ETT inserted through vocal cords under direct vision,  positive ETCO2,  CO2 detector and breath sounds checked- equal and bilateral Secured at: 22 cm Tube secured with: Tape Dental Injury: Teeth and Oropharynx as per pre-operative assessment

## 2015-03-23 NOTE — Anesthesia Preprocedure Evaluation (Addendum)
Anesthesia Evaluation  Patient identified by MRN, date of birth, ID band Patient awake    Reviewed: Allergy & Precautions, NPO status , Patient's Chart, lab work & pertinent test results  Airway Mallampati: II  TM Distance: >3 FB Neck ROM: Full    Dental no notable dental hx.    Pulmonary neg pulmonary ROS, former smoker,  breath sounds clear to auscultation  Pulmonary exam normal       Cardiovascular Exercise Tolerance: Good hypertension, Pt. on medications and Pt. on home beta blockers + CAD, + CABG and + Peripheral Vascular Disease Normal cardiovascular examRhythm:Regular Rate:Normal     Neuro/Psych PSYCHIATRIC DISORDERS Depression negative neurological ROS     GI/Hepatic negative GI ROS, Neg liver ROS,   Endo/Other  negative endocrine ROS  Renal/GU negative Renal ROS  negative genitourinary   Musculoskeletal negative musculoskeletal ROS (+)   Abdominal (+) + obese,   Peds negative pediatric ROS (+)  Hematology negative hematology ROS (+)   Anesthesia Other Findings   Reproductive/Obstetrics negative OB ROS                            Anesthesia Physical Anesthesia Plan  ASA: III and emergent  Anesthesia Plan: General   Post-op Pain Management:    Induction: Intravenous  Airway Management Planned: Oral ETT  Additional Equipment:   Intra-op Plan:   Post-operative Plan: Extubation in OR  Informed Consent: I have reviewed the patients History and Physical, chart, labs and discussed the procedure including the risks, benefits and alternatives for the proposed anesthesia with the patient or authorized representative who has indicated his/her understanding and acceptance.   Dental advisory given  Plan Discussed with: CRNA  Anesthesia Plan Comments:         Anesthesia Quick Evaluation

## 2015-03-23 NOTE — Interval H&P Note (Signed)
History and Physical Interval Note:  03/23/2015 6:44 PM  [name] [name]  has presented today for surgery, with the diagnosis of Left Trimalleolar Ankle Fracture  The various methods of treatment have been discussed with the patient and family. After consideration of risks, benefits and other options for treatment, the patient has consented to  Procedure(s): OPEN REDUCTION INTERNAL FIXATION (ORIF) ANKLE FRACTURE (Left) as a surgical intervention .  The patient's history has been reviewed, patient examined, no change in status, stable for surgery.  I have reviewed the patient's chart and labs.  Questions were answered to the patient's satisfaction.     YATES,MARK C

## 2015-03-24 ENCOUNTER — Encounter (HOSPITAL_COMMUNITY): Payer: Self-pay | Admitting: Orthopaedic Surgery

## 2015-03-24 DIAGNOSIS — E785 Hyperlipidemia, unspecified: Secondary | ICD-10-CM | POA: Diagnosis not present

## 2015-03-24 DIAGNOSIS — S82852A Displaced trimalleolar fracture of left lower leg, initial encounter for closed fracture: Secondary | ICD-10-CM | POA: Diagnosis not present

## 2015-03-24 DIAGNOSIS — I6523 Occlusion and stenosis of bilateral carotid arteries: Secondary | ICD-10-CM | POA: Diagnosis not present

## 2015-03-24 DIAGNOSIS — F329 Major depressive disorder, single episode, unspecified: Secondary | ICD-10-CM | POA: Diagnosis not present

## 2015-03-24 DIAGNOSIS — I2510 Atherosclerotic heart disease of native coronary artery without angina pectoris: Secondary | ICD-10-CM | POA: Diagnosis not present

## 2015-03-24 DIAGNOSIS — I10 Essential (primary) hypertension: Secondary | ICD-10-CM | POA: Diagnosis not present

## 2015-03-24 MED ORDER — METHOCARBAMOL 500 MG PO TABS: 500.0000 mg | ORAL_TABLET | Freq: Four times a day (QID) | ORAL | Status: DC | PRN

## 2015-03-24 MED ORDER — OXYCODONE-ACETAMINOPHEN 5-325 MG PO TABS: 1.0000 | ORAL_TABLET | Freq: Four times a day (QID) | ORAL | Status: DC | PRN

## 2015-03-24 NOTE — Progress Notes (Signed)
OT Cancellation Note  Patient Details Name: [name] [name] MRN: [id number] DOB: 1951-03-09   Cancelled Treatment:    Reason Eval/Treat Not Completed: OT screened, no needs identified, will sign off.  Pt has been managing NWB at home on crutches for 3 days.  Doesn't feel he needs OT.  He will have siblings and neighbors A.  He has been getting into shower:  He will check with MD if instructions do not address showering.    Kapena Hamme 03/24/2015, 11:29 AM  Lesle Chris, OTR/L [phone] 03/24/2015

## 2015-03-24 NOTE — Op Note (Signed)
[name] [name], [name] [name]                ACCOUNT NO.:  [id number]  MEDICAL RECORD NO.:  [id number]  LOCATION:  30                         FACILITY:  [facility]  PHYSICIAN:  Heinz Eckert C. [name], M.D.    DATE OF BIRTH:  07/24/1951  DATE OF PROCEDURE:  03/23/2015 DATE OF DISCHARGE:                              OPERATIVE REPORT   PREOPERATIVE DIAGNOSIS:  Trimalleolar ankle fracture, left.  POSTOPERATIVE DIAGNOSIS:  Trimalleolar ankle fracture, left.  PROCEDURE:  Bimalleolar fixation of trimalleolar ankle fracture.  SURGEON:  [name] [name], M.D.  ANESTHESIA:  General.  TOURNIQUET TIME:  27 minutes.  IMPLANT:  Biomet stainless steel one-third tubular plate, 2 medial cancellous lag screws for the medial malleolus.  DESCRIPTION OF PROCEDURE:  After induction of general anesthesia, orotracheal intubation, proximal thigh tourniquet, prepping with DuraPrep, preoperative Ancef prophylaxis, standard prepping and draping was performed, C-shaped incision was made after sterile skin marker, time-out procedure, leg elevation and elevation of the tourniquet after Esmarch wrapping.  Medial malleolus was exposed.  Fracture hematoma was evacuated.  There was no blockage to reduction on the medial side. Lateral incision was made.  Subperiosteal dissection of the fibula was performed.  Fracture was reduced, held with a self-retaining clamp. Seven-hole one-third tubular plate was selected.  Holes were filled with cancellous 14 mm screws distally and proximally 14 mm bicortical screws were placed.  Once single hole was left open at the level of the fracture site anterior to posterior, screw was placed from proximal to distal lagging the fracture compressing it.  Medial side was then addressed with the fracture reduced, held with a towel clip, pinned with K-wire and then the screw was placed posterior, making sure it did not enter the joint, stayed in the cancellous bone and angled slightly posterior.  There was  good compression.  Second screw identical was placed, which again held the medial malleolus anatomic.  Final spot pictures, AP, lateral, and mortise were taken.  The syndesmosis was intact.  Medial clear space was reduced.  Anatomic reduction of the joint, irrigation, tourniquet deflation, hemostasis, 2-0 Vicryl. Saphenous vein was spared.  Skin staple closure both areas and then short-leg splint with Xeroform, 4 x 4s, Webril, 5 x 30s, Webril, and Ace wrap.    [name] [name], M.D.    MCY/MEDQ  D:  03/23/2015  T:  03/24/2015  Job:  [id number]

## 2015-03-24 NOTE — Care Management Note (Signed)
Case Management Note  Patient Details  Name: [name] [name] MRN: [id number] Date of Birth: 1950-11-28  Subjective/Objective:                   OPEN REDUCTION INTERNAL FIXATION (ORIF) ANKLE FRACTURE (Left) Action/Plan: Discharge planning  Expected Discharge Date:  03/24/15               Expected Discharge Plan:  Home/Self Care  In-House Referral:     Discharge planning Services  CM Consult  Post Acute Care Choice:    Choice offered to:     DME Arranged:  Walker rolling DME Agency:  [place]:    [place]:     Status of Service:  Completed, signed off  Medicare Important Message Given:    Date Medicare IM Given:    Medicare IM give by:    Date Additional Medicare IM Given:    Additional Medicare Important Message give by:     If discussed at [place] of Stay Meetings, dates discussed:    Additional Comments: CM notified to please arrange for rolling walker.  Order requested.  CM called [facility] DME rep, Lecretia to please deliver the rolling walker to room so pt can discharge.  NO PT/OT recc or ordered.  No other CM needs were communicated. Dellie Catholic, RN 03/24/2015, 11:22 AM

## 2015-03-24 NOTE — Progress Notes (Signed)
Subjective: Doing well.  Pain controlled.     Objective: Vital signs in last 24 hours: Temp:  [97.6 F (36.4 C)-98.5 F (36.9 C)] 98.1 F (36.7 C) (08/10 0605) Pulse Rate:  [62-72] 63 (08/10 0605) Resp:  [13-16] 16 (08/10 0605) BP: (110-148)/(65-87) 128/72 mmHg (08/10 0605) SpO2:  [95 %-100 %] 100 % (08/10 0605) Weight:  [101.152 kg (223 lb)] 101.152 kg (223 lb) (08/09 1610)  Intake/Output from previous day: 08/09 0701 - 08/10 0700 In: 1725 [P.O.:600; I.V.:1075; IV Piggyback:50] Out: 575 [Urine:575] Intake/Output this shift:     Recent Labs  03/23/15 1649  HGB 13.3    Recent Labs  03/23/15 1649  WBC 7.5  RBC 4.17*  HCT 38.9*  PLT 248    Recent Labs  03/23/15 1649  NA 141  K 4.1  CL 107  CO2 25  BUN 14  CREATININE 1.11  GLUCOSE 92  CALCIUM 9.4    Recent Labs  03/23/15 1649  INR 1.03    Exam:  Alert and oriented.  Splint intact left LE.  Moves toes well.  Good capillary refill.    Assessment/Plan: Will see how he does with PT today.  Does not have much assistance at home so question whether or not will need short snf placement for rehab.  Saline lock IV.    [name] [name],[name] [name] 03/24/2015, 7:51 AM

## 2015-03-24 NOTE — Evaluation (Signed)
Physical Therapy Evaluation Patient Details Name: [name] [name] MRN: [id number] DOB: [birth year] Today's Date: 03/24/2015   History of Present Illness  L ankle fx  Clinical Impression  Pt s/p L ankle fx presents with functional mobility limitations 2* TDWB and post op pain.  Pt currently mobilizing at [place] I level and plans for dc home with family assist as needed.    Follow Up Recommendations No PT follow up    Equipment Recommendations  Rolling walker with 5" wheels    Recommendations for Other Services OT consult     Precautions / Restrictions Precautions Precautions: Fall Restrictions Weight Bearing Restrictions: Yes LLE Weight Bearing: Touchdown weight bearing      Mobility  Bed Mobility Overal bed mobility: Modified Independent                Transfers Overall transfer level: Needs assistance Equipment used: Rolling walker (2 wheeled) Transfers: Sit to/from Stand Sit to Stand: Supervision         General transfer comment: cues for LE management and use of UEs to self assist  Ambulation/Gait Ambulation/Gait assistance: Supervision;Modified independent (Device/Increase time) Ambulation Distance (Feet): 80 Feet (twice) Assistive device: Rolling walker (2 wheeled) Gait Pattern/deviations: Step-to pattern;Shuffle;Trunk flexed     General Gait Details: cues for posture, sequence and position from RW  Stairs Stairs: Yes Stairs assistance: Min guard Stair Management: One rail Right;Step to pattern;Forwards;With crutches Number of Stairs: 4 General stair comments: min cues for sequence and foot/crutch placement.  Also did curb bkwd with RW  Wheelchair Mobility    Modified Rankin (Stroke Patients Only)       Balance Overall balance assessment: Needs assistance Sitting-balance support: No upper extremity supported;Feet supported Sitting balance-Leahy Scale: Normal     Standing balance support: Bilateral upper extremity supported Standing  balance-Leahy Scale: Fair                               Pertinent Vitals/Pain Pain Assessment: 0-10 Pain Score: 3  Pain Location: L ankle Pain Descriptors / Indicators: Aching Pain Intervention(s): Limited activity within patient's tolerance;Monitored during session;Premedicated before session    Home Living Family/patient expects to be discharged to:: Private residence Living Arrangements: Alone Available Help at Discharge: Family Type of Home: House Home Access: Stairs to enter Entrance Stairs-Rails: Psychiatric nurse of Steps: 2 Home Layout: One level Home Equipment: Crutches;Bedside commode;Shower seat Additional Comments: Pt states has a lot of family assist    Prior Function Level of Independence: Independent with assistive device(s)         Comments: Pt was on crutches for several days.  Reports near falls     Hand Dominance   Dominant Hand: Right    Extremity/Trunk Assessment   Upper Extremity Assessment: Overall WFL for tasks assessed           Lower Extremity Assessment: LLE deficits/detail   LLE Deficits / Details: ankle immobilized  Cervical / Trunk Assessment: Normal  Communication   Communication: No difficulties  Cognition Arousal/Alertness: Awake/alert Behavior During Therapy: WFL for tasks assessed/performed Overall Cognitive Status: Within Functional Limits for tasks assessed                      General Comments      Exercises        Assessment/Plan    PT Assessment Patient needs continued PT services  PT Diagnosis Difficulty walking   PT Problem List  Decreased strength;Decreased range of motion;Decreased activity tolerance;Decreased mobility;Decreased knowledge of use of DME;Pain  PT Treatment Interventions DME instruction;Gait training;Stair training;Functional mobility training;Therapeutic activities;Patient/family education   PT Goals (Current goals can be found in the Care Plan section)  Acute Rehab PT Goals Patient Stated Goal: HOME PT Goal Formulation: All assessment and education complete, DC therapy    Frequency Min 1X/week   Barriers to discharge        Co-evaluation               End of Session Equipment Utilized During Treatment: Gait belt Activity Tolerance: Patient tolerated treatment well Patient left: in chair;with call bell/phone within reach Nurse Communication: Mobility status    Functional Assessment Tool Used: Clinical judgement Functional Limitation: Mobility: Walking and moving around Mobility: Walking and Moving Around Current Status (W4097): At least 1 percent but less than 20 percent impaired, limited or restricted Mobility: Walking and Moving Around Goal Status [phone]): At least 1 percent but less than 20 percent impaired, limited or restricted Mobility: Walking and Moving Around Discharge Status [phone]): At least 1 percent but less than 20 percent impaired, limited or restricted    Time: 0817-0855 PT Time Calculation (min) (ACUTE ONLY): 38 min   Charges:   PT Evaluation $Initial PT Evaluation Tier I: 1 Procedure PT Treatments $Gait Training: 8-22 mins   PT G Codes:   PT G-Codes **NOT FOR INPATIENT CLASS** Functional Assessment Tool Used: Clinical judgement Functional Limitation: Mobility: Walking and moving around Mobility: Walking and Moving Around Current Status (S3419): At least 1 percent but less than 20 percent impaired, limited or restricted Mobility: Walking and Moving Around Goal Status [phone]): At least 1 percent but less than 20 percent impaired, limited or restricted Mobility: Walking and Moving Around Discharge Status [phone]): At least 1 percent but less than 20 percent impaired, limited or restricted    Tristen Luce 03/24/2015, 9:53 AM

## 2015-03-25 DIAGNOSIS — I10 Essential (primary) hypertension: Secondary | ICD-10-CM | POA: Diagnosis not present

## 2015-03-25 DIAGNOSIS — F329 Major depressive disorder, single episode, unspecified: Secondary | ICD-10-CM | POA: Diagnosis not present

## 2015-03-25 DIAGNOSIS — S82852A Displaced trimalleolar fracture of left lower leg, initial encounter for closed fracture: Secondary | ICD-10-CM | POA: Diagnosis not present

## 2015-03-25 DIAGNOSIS — E785 Hyperlipidemia, unspecified: Secondary | ICD-10-CM | POA: Diagnosis not present

## 2015-03-25 DIAGNOSIS — I6523 Occlusion and stenosis of bilateral carotid arteries: Secondary | ICD-10-CM | POA: Diagnosis not present

## 2015-03-25 DIAGNOSIS — I2510 Atherosclerotic heart disease of native coronary artery without angina pectoris: Secondary | ICD-10-CM | POA: Diagnosis not present

## 2015-03-25 NOTE — Care Management Note (Signed)
Case Management Note  Patient Details  Name: [name] [name] MRN: [id number] Date of Birth: 1950/12/25  Subjective/Objective:                   OPEN REDUCTION INTERNAL FIXATION (ORIF) ANKLE FRACTURE (Left) Action/Plan: Discharge planning  Expected Discharge Date:  03/25/15               Expected Discharge Plan:  Home/Self Care  In-House Referral:     Discharge planning Services  CM Consult  Post Acute Care Choice:    Choice offered to:     DME Arranged:  Walker rolling DME Agency:  [place]:    [place]:     Status of Service:  Completed, signed off  Medicare Important Message Given:    Date Medicare IM Given:    Medicare IM give by:    Date Additional Medicare IM Given:    Additional Medicare Important Message give by:     If discussed at [place] of Stay Meetings, dates discussed:    Additional Comments: Pt requested information for scooter.  CM placed [place] on AVS for pt to rent or purchase scooter.  NO other CM needs were communicated. Dellie Catholic, RN 03/25/2015, 10:37 AM

## 2015-03-25 NOTE — Progress Notes (Signed)
Physical Therapy Treatment Patient Details Name: [name] [name] MRN: [id number] DOB: [birth year] Today's Date: 03/25/2015    History of Present Illness L ankle fx    PT Comments    Pt doing well with mobility.  Reviewed home therex program and mobilizing with knee scooter.  Pt states comfortable with abilities and eager for dc home.  Follow Up Recommendations  No PT follow up     Equipment Recommendations  Rolling walker with 5" wheels    Recommendations for Other Services       Precautions / Restrictions Precautions Precautions: Fall Restrictions Weight Bearing Restrictions: Yes LLE Weight Bearing: Touchdown weight bearing    Mobility  Bed Mobility Overal bed mobility: Modified Independent                Transfers Overall transfer level: Modified independent Equipment used: None Transfers: Sit to/from Stand Sit to Stand: Modified independent (Device/Increase time)            Ambulation/Gait Ambulation/Gait assistance: Modified independent (Device/Increase time) Ambulation Distance (Feet): 10 Feet Assistive device: Rolling walker (2 wheeled) Gait Pattern/deviations: Step-to pattern         Stairs Stairs:  (Pt states comfortable with stairs)          Wheelchair Mobility    Modified Rankin (Stroke Patients Only)       Balance                                    Cognition Arousal/Alertness: Awake/alert Behavior During Therapy: WFL for tasks assessed/performed Overall Cognitive Status: Within Functional Limits for tasks assessed                      Exercises General Exercises - Lower Extremity Ankle Circles/Pumps: AROM;Right;10 reps;Supine Quad Sets: AROM;Both;10 reps;Supine Gluteal Sets: AROM;Both;10 reps;Supine Long Arc Quad: AROM;Left;10 reps;Seated Hip ABduction/ADduction: AROM;Left;10 reps;Supine Straight Leg Raises: AROM;Left;10 reps;Supine Hip Flexion/Marching: AROM;Left;10 reps;Standing    General  Comments        Pertinent Vitals/Pain Pain Assessment: 0-10 Pain Score: 4  Pain Location: L ankle Pain Descriptors / Indicators: Aching;Sore Pain Intervention(s): Limited activity within patient's tolerance;Monitored during session;Premedicated before session    Home Living                      Prior Function            PT Goals (current goals can now be found in the care plan section) Acute Rehab PT Goals Patient Stated Goal: HOME Progress towards PT goals: Progressing toward goals    Frequency  Min 1X/week    PT Plan Current plan remains appropriate    Co-evaluation             End of Session Equipment Utilized During Treatment: Gait belt Activity Tolerance: Patient tolerated treatment well Patient left: in bed;with call bell/phone within reach     Time: 0932-1004 PT Time Calculation (min) (ACUTE ONLY): 32 min  Charges:  $Therapeutic Exercise: 23-37 mins                    G Codes:  Functional Assessment Tool Used: Clinical judgement Functional Limitation: Mobility: Walking and moving around Mobility: Walking and Moving Around Current Status (V3710): At least 1 percent but less than 20 percent impaired, limited or restricted Mobility: Walking and Moving Around Goal Status [phone]): At least 1 percent but less than  20 percent impaired, limited or restricted Mobility: Walking and Moving Around Discharge Status [phone]): At least 1 percent but less than 20 percent impaired, limited or restricted   [facility] 03/25/2015, 10:39 AM

## 2015-03-25 NOTE — Progress Notes (Signed)
Subjective: Doing very well.  Good progress with PT.  Wants to go home.    Objective: Vital signs in last 24 hours: Temp:  [98.1 F (36.7 C)-98.6 F (37 C)] 98.1 F (36.7 C) (08/10 2010) Pulse Rate:  [63-67] 63 (08/10 2139) Resp:  [16] 16 (08/10 2010) BP: (115-127)/(63-73) 127/64 mmHg (08/10 2139) SpO2:  [97 %-98 %] 98 % (08/10 2010)  Intake/Output from previous day: 08/10 0701 - 08/11 0700 In: 1080 [P.O.:1080] Out: 1025 [Urine:1025] Intake/Output this shift: Total I/O In: 360 [P.O.:360] Out: 450 [Urine:450]   Recent Labs  03/23/15 1649  HGB 13.3    Recent Labs  03/23/15 1649  WBC 7.5  RBC 4.17*  HCT 38.9*  PLT 248    Recent Labs  03/23/15 1649  NA 141  K 4.1  CL 107  CO2 25  BUN 14  CREATININE 1.11  GLUCOSE 92  CALCIUM 9.4    Recent Labs  03/23/15 1649  INR 1.03    Exam:  Alert and oriented.  Splint intact. Dressing c/d/i.  Moves toes well.  Good capillary refill.   Assessment/Plan: D/c home today.  F/u in office next week.  Scripts on chart for percocet and robaxin.    [name] [name] M 03/25/2015, 8:16 AM

## 2015-03-25 NOTE — Discharge Instructions (Signed)
Nonweightbearing left foot.  Do not remove splint/dressing or get wet.  Elevate left foot above heart level as much as possible.  No driving.

## 2015-03-29 ENCOUNTER — Encounter (HOSPITAL_COMMUNITY): Payer: Medicare Other

## 2015-03-31 DIAGNOSIS — S82853D Displaced trimalleolar fracture of unspecified lower leg, subsequent encounter for closed fracture with routine healing: Secondary | ICD-10-CM | POA: Diagnosis not present

## 2015-03-31 NOTE — Discharge Summary (Signed)
Patient ID: DOW BLAHNIK MRN: [id number] DOB/AGE: 1951-04-20 64 y.o.  Admit date: 03/23/2015 Discharge date: 03/31/2015  Admission Diagnoses:  Active Problems:   Ankle fracture   Discharge Diagnoses:  Active Problems:   Ankle fracture  status post Procedure(s): OPEN REDUCTION INTERNAL FIXATION (ORIF) ANKLE FRACTURE  Past Medical History  Diagnosis Date  . Dyslipidemia   . Bilateral carotid artery stenosis     Mild  . Melanoma 2010    of the scalp Dr Wendee Beavers [place] Quale  . Depression     Dr Charissa Bash  . Hypertension   . Coronary artery disease     s/pCABG  . Carotid artery stenosis 03/20/2015    1-39% bilateral on dopplers 03/2015    Surgeries: Procedure(s): OPEN REDUCTION INTERNAL FIXATION (ORIF) ANKLE FRACTURE on 03/23/2015   Consultants:    Discharged Condition: Improved  Hospital Course: [name] [name] is an 64 y.o. male who was admitted 03/23/2015 for operative treatment of ankle fracture.  Patient failed conservative treatments (please see the history and physical for the specifics) and had severe unremitting pain that affects sleep, daily activities and work/hobbies. After pre-op clearance, the patient was taken to the operating room on 03/23/2015 and underwent  Procedure(s): OPEN REDUCTION INTERNAL FIXATION (ORIF) ANKLE FRACTURE.    Patient was given perioperative antibiotics:  Anti-infectives    Start     Dose/Rate Route Frequency Ordered Stop   03/23/15 1621  clindamycin (CLEOCIN) IVPB 900 mg     900 mg 100 mL/hr over 30 Minutes Intravenous 30 min pre-op 03/23/15 1614 03/23/15 1925       Patient was given sequential compression devices and early ambulation to prevent DVT.   Patient benefited maximally from hospital stay and there were no complications. At the time of discharge, the patient was urinating/moving their bowels without difficulty, tolerating a regular diet, pain is controlled with oral pain medications and they have been cleared by PT/OT.   Recent  vital signs: No data found.    Recent laboratory studies: No results for input(s): WBC, HGB, HCT, PLT, NA, K, CL, CO2, BUN, CREATININE, GLUCOSE, INR, CALCIUM in the last 72 hours.  Invalid input(s): PT, 2   Discharge Medications:     Medication List    STOP taking these medications        ibuprofen 200 MG tablet  Commonly known as:  ADVIL,MOTRIN     multivitamin tablet     TYLENOL EXTRA STRENGTH PO      TAKE these medications        aspirin 81 MG tablet  Take 1 tablet (81 mg total) by mouth daily.     CALCIUM 500 +D PO  Take 1 tablet by mouth daily.     ezetimibe 10 MG tablet  Commonly known as:  ZETIA  Take 1 tablet (10 mg total) by mouth daily.     famotidine 20 MG tablet  Commonly known as:  PEPCID  Take 20 mg by mouth 2 (two) times daily.     Fish Oil 1000 MG Caps  Take 1 capsule by mouth 2 (two) times daily.     fluticasone 50 MCG/ACT nasal spray  Commonly known as:  FLONASE  Place 1 spray into both nostrils as needed for allergies or rhinitis.     loratadine 10 MG tablet  Commonly known as:  CLARITIN  Take 10 mg by mouth daily.     methocarbamol 500 MG tablet  Commonly known as:  ROBAXIN  Take 1 tablet (  500 mg total) by mouth every 6 (six) hours as needed for muscle spasms.     metoprolol succinate 25 MG 24 hr tablet  Commonly known as:  TOPROL XL  Take 1 tablet (25 mg total) by mouth daily.     metoprolol tartrate 25 MG tablet  Commonly known as:  LOPRESSOR  Take 12.5 mg by mouth 2 (two) times daily.     oxyCODONE-acetaminophen 5-325 MG per tablet  Commonly known as:  PERCOCET/ROXICET  Take 1-2 tablets by mouth every 6 (six) hours as needed for severe pain.     rosuvastatin 40 MG tablet  Commonly known as:  CRESTOR  Take 1 tablet (40 mg total) by mouth daily.     vitamin C 500 MG tablet  Commonly known as:  ASCORBIC ACID  Take 500 mg by mouth daily.        Diagnostic Studies: Dg Ankle Complete Left  03-27-15   CLINICAL DATA:   Fixation of left ankle fracture  EXAM: LEFT ANKLE COMPLETE - 3+ VIEW  COMPARISON:  Left ankle films of 03/20/2015  FINDINGS: Three C-arm spot films show screw fixation of the medial malleolar fracture. In addition plate and screw fixation of the distal left fibular fracture is noted in good position and alignment on the images obtained. The ankle joint appears normal.  IMPRESSION: Fixation of medial malleolar and lateral malleolar fractures.   Electronically Signed   By: Ivar Drape M.D.   On: 03-27-15 08:21   Dg Ankle Complete Left  03/20/2015   CLINICAL DATA:  Patient slipped on a ramp and left foot went under ramp, left ankle swollen and bruised, left ankle pain  EXAM: LEFT ANKLE COMPLETE - 3+ VIEW  COMPARISON:  None.  FINDINGS: Mildly displaced oblique fracture distal fibula. Moderately displaced fracture medial malleolus. [place] appears widened medially. Mildly displaced fracture posterior malleolus. Significant soft tissue swelling. Ankle joint effusion.  IMPRESSION: Trimalleolar fracture   Electronically Signed   By: Skipper Cliche M.D.   On: 03/20/2015 11:50   Dg Foot Complete Left  03/20/2015   CLINICAL DATA:  Patient slipped and fell, left ankle and foot pain  EXAM: LEFT FOOT - COMPLETE 3+ VIEW  COMPARISON:  Ankle radiographs  FINDINGS: Known trimalleolar ankle fracture. No abnormalities involving the foot.  IMPRESSION: Negative   Electronically Signed   By: Skipper Cliche M.D.   On: 03/20/2015 11:55   Dg C-arm 1-60 Min-no Report  03/23/2015   CLINICAL DATA: surgery   C-ARM 1-60 MINUTES  Fluoroscopy was utilized by the requesting physician.  No radiographic  interpretation.           Follow-up Information    Follow up with [place].   Why:  rolling walker   Contact information:   [address] [place] [address] [phone]       Schedule an appointment as soon as possible for a visit with Marybelle Killings, MD.   Specialty:  Orthopedic Surgery   Why:   need return office visit one week.    Contact information:   [place] [address] [phone]       Follow up with [employer].   Why:  you can purchase or rent a scooter   Contact information:   [facility] [address] [place],  [address] [phone]      Discharge Plan:  discharge to home  Disposition:     Signed: Lanae Crumbly  03/31/2015, 4:32 PM

## 2015-04-06 DIAGNOSIS — S82853D Displaced trimalleolar fracture of unspecified lower leg, subsequent encounter for closed fracture with routine healing: Secondary | ICD-10-CM | POA: Diagnosis not present

## 2015-04-18 ENCOUNTER — Other Ambulatory Visit: Payer: Self-pay | Admitting: Cardiology

## 2015-04-30 DIAGNOSIS — H5203 Hypermetropia, bilateral: Secondary | ICD-10-CM | POA: Diagnosis not present

## 2015-04-30 DIAGNOSIS — H2513 Age-related nuclear cataract, bilateral: Secondary | ICD-10-CM | POA: Diagnosis not present

## 2015-04-30 DIAGNOSIS — H524 Presbyopia: Secondary | ICD-10-CM | POA: Diagnosis not present

## 2015-04-30 DIAGNOSIS — H43813 Vitreous degeneration, bilateral: Secondary | ICD-10-CM | POA: Diagnosis not present

## 2015-05-04 DIAGNOSIS — S82853D Displaced trimalleolar fracture of unspecified lower leg, subsequent encounter for closed fracture with routine healing: Secondary | ICD-10-CM | POA: Diagnosis not present

## 2015-05-13 ENCOUNTER — Telehealth (HOSPITAL_COMMUNITY): Payer: Self-pay | Admitting: *Deleted

## 2015-05-13 NOTE — Telephone Encounter (Signed)
Left message on voicemail in reference to upcoming appointment scheduled for 05/17/15. Phone number given for a call back so details instructions can be given. Hubbard Robinson, RN

## 2015-05-17 ENCOUNTER — Ambulatory Visit (HOSPITAL_COMMUNITY): Payer: Medicare Other | Attending: Cardiology

## 2015-05-17 DIAGNOSIS — I779 Disorder of arteries and arterioles, unspecified: Secondary | ICD-10-CM | POA: Diagnosis not present

## 2015-05-17 DIAGNOSIS — R0609 Other forms of dyspnea: Secondary | ICD-10-CM | POA: Diagnosis not present

## 2015-05-17 DIAGNOSIS — R0602 Shortness of breath: Secondary | ICD-10-CM | POA: Diagnosis not present

## 2015-05-17 DIAGNOSIS — I10 Essential (primary) hypertension: Secondary | ICD-10-CM | POA: Insufficient documentation

## 2015-05-17 LAB — MYOCARDIAL PERFUSION IMAGING
CHL CUP RESTING HR STRESS: 73 {beats}/min
CSEPPHR: 98 {beats}/min
LV sys vol: 32 mL
LVDIAVOL: 89 mL
NUC STRESS TID: 1.06
RATE: 0.37
SDS: 1
SRS: 8
SSS: 9

## 2015-05-17 MED ORDER — TECHNETIUM TC 99M SESTAMIBI GENERIC - CARDIOLITE
11.0000 | Freq: Once | INTRAVENOUS | Status: AC | PRN
  Administered 2015-05-17: 11 via INTRAVENOUS

## 2015-05-17 MED ORDER — REGADENOSON 0.4 MG/5ML IV SOLN
0.4000 mg | Freq: Once | INTRAVENOUS | Status: AC
  Administered 2015-05-17: 0.4 mg via INTRAVENOUS

## 2015-05-17 MED ORDER — TECHNETIUM TC 99M SESTAMIBI GENERIC - CARDIOLITE
32.2000 | Freq: Once | INTRAVENOUS | Status: AC | PRN
  Administered 2015-05-17: 32.2 via INTRAVENOUS

## 2015-05-31 DIAGNOSIS — Z23 Encounter for immunization: Secondary | ICD-10-CM | POA: Diagnosis not present

## 2015-05-31 DIAGNOSIS — M546 Pain in thoracic spine: Secondary | ICD-10-CM | POA: Diagnosis not present

## 2015-06-01 DIAGNOSIS — S82853D Displaced trimalleolar fracture of unspecified lower leg, subsequent encounter for closed fracture with routine healing: Secondary | ICD-10-CM | POA: Diagnosis not present

## 2015-06-07 ENCOUNTER — Ambulatory Visit: Payer: Medicare Other | Attending: Orthopedic Surgery | Admitting: Physical Therapy

## 2015-06-07 DIAGNOSIS — R29898 Other symptoms and signs involving the musculoskeletal system: Secondary | ICD-10-CM | POA: Diagnosis not present

## 2015-06-07 DIAGNOSIS — M25472 Effusion, left ankle: Secondary | ICD-10-CM | POA: Diagnosis not present

## 2015-06-07 DIAGNOSIS — M259 Joint disorder, unspecified: Secondary | ICD-10-CM | POA: Diagnosis not present

## 2015-06-07 DIAGNOSIS — M25673 Stiffness of unspecified ankle, not elsewhere classified: Secondary | ICD-10-CM

## 2015-06-07 NOTE — Patient Instructions (Signed)
   Abisola Carrero PT, DPT, LAT, ATC  [facility] Phone: [phone]     

## 2015-06-07 NOTE — Therapy (Signed)
[name], [place], [address] Phone: [phone]   Fax:  [phone]  Physical Therapy Evaluation  Patient Details  Name: [name] [name] MRN: [id number] Date of Birth: 1951/06/25 Referring Provider: Paralee Cancel  Encounter Date: 06/07/2015      PT End of Session - 06/07/15 1638    Visit Number 1   Number of Visits 4   Date for PT Re-Evaluation 07/05/15   Authorization Type Medicare, [place] modiifer by 15th visit and progress note by 9th visit   PT Start Time 1545   PT Stop Time 1630   PT Time Calculation (min) 45 min   Activity Tolerance Patient tolerated treatment well   Behavior During Therapy [facility] for tasks assessed/performed      Past Medical History  Diagnosis Date  . Dyslipidemia   . Bilateral carotid artery stenosis     Mild  . Melanoma 2010    of the scalp Dr Wendee Beavers Harvel Quale  . Depression     Dr Charissa Bash  . Hypertension   . Coronary artery disease     s/pCABG  . Carotid artery stenosis 03/20/2015    1-39% bilateral on dopplers 03/2015    Past Surgical History  Procedure Laterality Date  . Coronary artery bypass graft  2007  . Fracture surgery      ankle fracture aug 2016  . Orif ankle fracture Left 03/23/2015    Procedure: OPEN REDUCTION INTERNAL FIXATION (ORIF) ANKLE FRACTURE;  Surgeon: Marybelle Killings, MD;  Location: [facility];  Service: Orthopedics;  Laterality: Left;    There were no vitals filed for this visit.  Visit Diagnosis:  Left ankle swelling - Plan: PT plan of care cert/re-cert  Decreased ROM of ankle - Plan: PT plan of care cert/re-cert  Ankle weakness - Plan: PT plan of care cert/re-cert      Subjective Assessment - 06/07/15 1549    Subjective pt is a 64 y.o M s/p L ankle ORIF on 03/24/2015 from a trimalleour fracture. since the surgery pt reports that things have been going well with not as much pain as he has expected.    How long can you sit comfortably? 3 hours   How  long can you stand comfortably? 4-5 min   How long can you walk comfortably? 4-5 min   Diagnostic tests 06/01/2015 reported that things were going well and healing well   Patient Stated Goals to be able to walk as long as want to.    Currently in Pain? Yes   Pain Score 0-No pain   Pain Location Ankle   Pain Orientation Left   Pain Type Surgical pain   Aggravating Factors  N/A   Pain Relieving Factors N/A            [facility] PT Assessment - 06/07/15 1554    Assessment   Medical Diagnosis L ankle s/p ORIF   Referring Provider Paralee Cancel   Onset Date/Surgical Date 05/24/15   Hand Dominance Right   Next MD Visit 06/24/2015   Prior Therapy no   Precautions   Precautions None   Restrictions   Weight Bearing Restrictions No   Balance Screen   Has the patient fallen in the past 6 months No   Has the patient had a decrease in activity level because of a fear of falling?  No   Is the patient reluctant to leave their home because of a fear of falling?  No   Home Environment  Living Environment Private residence   Living Arrangements Alone   Type of [place] to enter   Entrance Stairs-Number of Steps 3   Entrance Stairs-Rails Can reach both   [place] One level   Prior Function   Level of Independence Independent;Independent with basic ADLs   Vocation Full time employment  truck driver   Vocation Requirements prolonged sitting    Leisure riding motorcycle, biking, camping, hiking   Cognition   Overall Cognitive Status Within Functional Limits for tasks assessed   Observation/Other Assessments   Lower Extremity Functional Scale  41/80   Observation/Other Assessments-Edema    Edema Figure 8   Figure 8 Edema   Figure 8 - Left  57.5 cm   Posture/Postural Control   Posture/Postural Control Postural limitations   Postural Limitations Rounded Shoulders;Forward head   ROM / Strength   AROM / PROM / Strength AROM;PROM;Strength   AROM   AROM Assessment  Site Ankle   Right/Left Ankle Right;Left   Right Ankle Dorsiflexion 1   Right Ankle Plantar Flexion 50   Right Ankle Inversion 28   Right Ankle Eversion 20   Left Ankle Dorsiflexion -2   Left Ankle Plantar Flexion 48   Left Ankle Inversion 26   Left Ankle Eversion 18   PROM   PROM Assessment Site Ankle   Right/Left Ankle Left   Left Ankle Dorsiflexion 1   Left Ankle Plantar Flexion 55   Left Ankle Inversion 28   Left Ankle Eversion 30   Strength   Strength Assessment Site Ankle   Right/Left Ankle Right;Left   Right Ankle Dorsiflexion 5/5   Right Ankle Plantar Flexion 5/5   Right Ankle Inversion 5/5   Right Ankle Eversion 5/5   Left Ankle Dorsiflexion 4/5   Left Ankle Plantar Flexion 4/5   Left Ankle Inversion 4/5   Left Ankle Eversion 4/5   Palpation   Palpation comment pt reports no pain in the ankle   Ambulation/Gait   Gait Pattern Step-through pattern;Decreased stride length;Antalgic                           PT Education - 06/07/15 1638    Education provided Yes   Education Details evaluation findings, POC, goals, HEP   Person(s) Educated Patient   Methods Explanation   Comprehension Verbalized understanding          PT Short Term Goals - 06/07/15 1714    PT SHORT TERM GOAL #1   Title pt will be I with inital HEP (06/21/2015)   Time 2   Period Weeks   Status New           PT Long Term Goals - 06/07/15 1715    PT LONG TERM GOAL #1   Title pt will be I with all HEP given throughout therapy (07/05/2015)   Time 4   Period Weeks   Status New   PT LONG TERM GOAL #2   Title pt will increase L ankle to > 4+/5 in all planes to assist with walking/ standing endurance (07/05/2015)   Time 4   Period Weeks   Status New   PT LONG TERM GOAL #3   Title pt will be able to tolerate standing/walking for > 15 minutes to promote community ambulation (07/05/2015)   Time 4   Period Weeks   Status New   PT LONG TERM GOAL #4   Title pt will  increase his LEFS score by > 10 points to demonstrate improved function at discharge (07/05/2015)   Time 4   Period Weeks   Status New               Plan - 07/01/15 1639    Clinical Impression Statement [name] [name] presents to OPPT s/p L ankle ORIF on 03/24/2015 from a trimalleolar ankle fracture. He demonstrates mild limitation with L ankle AROM compared bil. MMT revealed slight weakness compared bil in all planes. No pain was noted upon palpation.  Gait revealed a slight antalgic gait with decreased step length on the RLE and stance time on the LLE. pt would benefit from physical therapy to improve strength and mobility  and return to PLOF by addressing the impairments listed.    Pt will benefit from skilled therapeutic intervention in order to improve on the following deficits Abnormal gait;Improper body mechanics;Postural dysfunction;Increased edema;Decreased strength;Hypomobility   Rehab Potential Good   PT Frequency 1x / week   PT Duration 4 weeks   PT Treatment/Interventions ADLs/Self Care Home Management;Manual techniques;Therapeutic exercise;Therapeutic activities;Electrical Stimulation;Cryotherapy;Ultrasound;Moist Heat;Patient/family education;Passive range of motion;Taping;Gait training   PT Next Visit Plan assess response to HEP, balance training,    PT Home Exercise Plan 4-way ankle strengthening, towel scrunches, calf stretch, calf raises on step   Consulted and Agree with Plan of Care Patient          G-Codes - 2015-07-01 1713    Functional Assessment Tool Used LEFS 49% limited   Functional Limitation Mobility: Walking and moving around   Mobility: Walking and Moving Around Current Status [phone]) At least 40 percent but less than 60 percent impaired, limited or restricted   Mobility: Walking and Moving Around Goal Status [phone]) At least 1 percent but less than 20 percent impaired, limited or restricted       Problem List Patient Active Problem List   Diagnosis Date  Noted  . Ankle fracture 03/23/2015  . Carotid artery stenosis 03/20/2015  . Coronary artery disease   . Pure hypercholesterolemia 08/04/2013  . Essential hypertension, benign 08/04/2013  . Encounter for long-term (current) use of other medications 08/04/2013   Starr Lake PT, DPT, LAT, ATC  Jul 01, 2015  5:21 PM    [facility] [address] [place], [place], [address] Phone: [phone]   Fax:  [phone]  Name: [name] [name] MRN: [id number] Date of Birth: 08-May-1951

## 2015-06-14 ENCOUNTER — Ambulatory Visit: Payer: Medicare Other

## 2015-06-14 DIAGNOSIS — M25472 Effusion, left ankle: Secondary | ICD-10-CM

## 2015-06-14 DIAGNOSIS — M259 Joint disorder, unspecified: Secondary | ICD-10-CM | POA: Diagnosis not present

## 2015-06-14 DIAGNOSIS — R29898 Other symptoms and signs involving the musculoskeletal system: Secondary | ICD-10-CM | POA: Diagnosis not present

## 2015-06-14 DIAGNOSIS — M25673 Stiffness of unspecified ankle, not elsewhere classified: Secondary | ICD-10-CM

## 2015-06-14 NOTE — Patient Instructions (Signed)
Green and blue band issued to Mr Fredell  And worked on heel and toe walk he will try at home

## 2015-06-14 NOTE — Therapy (Signed)
[place], [place], [address] Phone: [phone]   Fax:  [phone]  Physical Therapy Treatment  Patient Details  Name: [name] [name] MRN: [id number] Date of Birth: 04-Jan-1951 Referring Provider: Paralee Cancel  Encounter Date: 06/14/2015      PT End of Session - 06/14/15 1054    Visit Number 2   Number of Visits 4   Date for PT Re-Evaluation 07/05/15   PT Start Time 8299   PT Stop Time 1053   PT Time Calculation (min) 38 min   Activity Tolerance Patient tolerated treatment well   Behavior During Therapy [facility] for tasks assessed/performed      Past Medical History  Diagnosis Date  . Dyslipidemia   . Bilateral carotid artery stenosis     Mild  . Melanoma 2010    of the scalp Dr Wendee Beavers Har[place] Quale  . Depression     Dr Charissa Bash  . Hypertension   . Coronary artery disease     s/pCABG  . Carotid artery stenosis 03/20/2015    1-39% bilateral on dopplers 03/2015    Past Surgical History  Procedure Laterality Date  . Coronary artery bypass graft  2007  . Fracture surgery      ankle fracture aug 2016  . Orif ankle fracture Left 03/23/2015    Procedure: OPEN REDUCTION INTERNAL FIXATION (ORIF) ANKLE FRACTURE;  Surgeon: Marybelle Killings, MD;  Location: [facility];  Service: Orthopedics;  Laterality: Left;    There were no vitals filed for this visit.  Visit Diagnosis:  Left ankle swelling  Decreased ROM of ankle  Ankle weakness      Subjective Assessment - 06/14/15 1018    Subjective Mr Knoght reports no pain and no limits with functional activity. He reportd popping with ROm and has reported this to surgeon without indication as to why the pop is there. He has done the HEP since Eval   Currently in Pain? No/denies                         [facility] Adult PT Treatment/Exercise - 06/14/15 1020    Neuro Re-ed    Neuro Re-ed Details  Single leg stand RT and LT leg with toss ball to rebounder  multiple bouts and he improved to 10 reps with LT and RT leg  , also did on blue therafoam and able to t0ss 3-5 reps. He was closely guarded   Ankle Exercises: Aerobic   Stationary Bike L2 5 min   Ankle Exercises: Standing   Other Standing Ankle Exercises DF stretch  knee straight and bent x2, then walk heel and walk on toe , tandem walk   Ankle Exercises: Seated   Other Seated Ankle Exercises 4 way red band x 12 then 50 towel scrunchs    single leg Pf LT and RT. X 12              PT Short Term Goals - 06/14/15 1057    PT SHORT TERM GOAL #1   Title pt will be I with inital HEP (06/21/2015)   Status Achieved           PT Long Term Goals - 06/07/15 1715    PT LONG TERM GOAL #1   Title pt will be I with all HEP given throughout therapy (07/05/2015)   Time 4   Period Weeks   Status New   PT LONG TERM GOAL #2  Title pt will increase L ankle to > 4+/5 in all planes to assist with walking/ standing endurance (07/05/2015)   Time 4   Period Weeks   Status New   PT LONG TERM GOAL #3   Title pt will be able to tolerate standing/walking for > 15 minutes to promote community ambulation (07/05/2015)   Time 4   Period Weeks   Status New   PT LONG TERM GOAL #4   Title pt will increase his LEFS score by > 10 points to demonstrate improved function at discharge (07/05/2015)   Time 4   Period Weeks   Status New               Plan - 06/14/15 1055    Clinical Impression Statement No pain post and tolerated heavier theraband. Balance on LT leg essetially = to RT. He reports RT leg weaker with single leg PF but range was essietially + to RT.  He is doing very well   PT Next Visit Plan assess response to HEP, balance training, strenght   Consulted and Agree with Plan of Care Patient        Problem List Patient Active Problem List   Diagnosis Date Noted  . Ankle fracture 03/23/2015  . Carotid artery stenosis 03/20/2015  . Coronary artery disease   . Pure  hypercholesterolemia 08/04/2013  . Essential hypertension, benign 08/04/2013  . Encounter for long-term (current) use of other medications 08/04/2013    Darrel Hoover PT 06/14/2015, 10:58 AM  [place], [place], [address] Phone: [phone]   Fax:  [phone]  Name: BRONISLAW SWITZER MRN: [id number] Date of Birth: 01-29-1951

## 2015-06-21 ENCOUNTER — Ambulatory Visit: Payer: Medicare Other | Attending: Orthopedic Surgery | Admitting: Physical Therapy

## 2015-06-21 DIAGNOSIS — M259 Joint disorder, unspecified: Secondary | ICD-10-CM | POA: Diagnosis not present

## 2015-06-21 DIAGNOSIS — R29898 Other symptoms and signs involving the musculoskeletal system: Secondary | ICD-10-CM | POA: Diagnosis not present

## 2015-06-21 DIAGNOSIS — M25673 Stiffness of unspecified ankle, not elsewhere classified: Secondary | ICD-10-CM

## 2015-06-21 DIAGNOSIS — M25472 Effusion, left ankle: Secondary | ICD-10-CM | POA: Diagnosis not present

## 2015-06-21 NOTE — Therapy (Signed)
[place], [place], [address] Phone: [phone]   Fax:  [phone]  Physical Therapy Treatment  Patient Details  Name: [name] [name] MRN: [id number] Date of Birth: 28-Nov-1950 Referring Provider: Paralee Cancel  Encounter Date: 06/21/2015      PT End of Session - 06/21/15 1139    Visit Number 3   Number of Visits 4   Date for PT Re-Evaluation 07/05/15   Authorization Type Medicare, [place]x modiifer by 15th visit and progress note by 9th visit   PT Start Time 1100   PT Stop Time 1140   PT Time Calculation (min) 40 min   Activity Tolerance Patient tolerated treatment well   Behavior During Therapy [facility] for tasks assessed/performed      Past Medical History  Diagnosis Date  . Dyslipidemia   . Bilateral carotid artery stenosis     Mild  . Melanoma 2010    of the scalp Dr Wendee Beavers Harvel Quale  . Depression     Dr Charissa Bash  . Hypertension   . Coronary artery disease     s/pCABG  . Carotid artery stenosis 03/20/2015    1-39% bilateral on dopplers 03/2015    Past Surgical History  Procedure Laterality Date  . Coronary artery bypass graft  2007  . Fracture surgery      ankle fracture aug 2016  . Orif ankle fracture Left 03/23/2015    Procedure: OPEN REDUCTION INTERNAL FIXATION (ORIF) ANKLE FRACTURE;  Surgeon: Marybelle Killings, MD;  Location: [facility];  Service: Orthopedics;  Laterality: Left;    There were no vitals filed for this visit.  Visit Diagnosis:  Decreased ROM of ankle  Left ankle swelling  Ankle weakness      Subjective Assessment - 06/21/15 1110    Subjective pt reports starting work and driving more, having no pain and being able to do more with the foot"    Currently in Pain? No/denies   Pain Score 0-No pain   Pain Location Ankle   Pain Orientation Left   Aggravating Factors  N/A   Pain Relieving Factors N/A            [facility] PT Assessment - 06/21/15 1129    Observation/Other  Assessments   Lower Extremity Functional Scale  75/80   AROM   Left Ankle Dorsiflexion 5   Left Ankle Plantar Flexion 50   Left Ankle Inversion 28   Left Ankle Eversion 18   Strength   Left Ankle Dorsiflexion 5/5   Left Ankle Plantar Flexion 5/5   Left Ankle Inversion 4+/5   Left Ankle Eversion 5/5                     [facility] Adult PT Treatment/Exercise - 06/21/15 1111    Ankle Exercises: Aerobic   Stationary Bike NuStep L7 x 5 min   Ankle Exercises: Stretches   Soleus Stretch 2 reps;30 seconds   Gastroc Stretch 2 reps;30 seconds  off edge of step   Ankle Exercises: Standing   BAPS Level 2   Rebounder 3 x 10 with yellow ball with SLS on the L.    Heel Walk (Round Trip) 2 x 15 ft   Toe Walk (Round Trip) 2 x 15 ft   Other Standing Ankle Exercises walking on slanted surface in pediatric area,    Ankle Exercises: Seated   Other Seated Ankle Exercises 4 way red band x 12 then 50 towel scrunchs  PT Education - Jun 29, 2015 1138    Education provided Yes   Education Details updated HEP   Person(s) Educated Patient   Methods Explanation   Comprehension Verbalized understanding          PT Short Term Goals - 06-29-15 1132    PT SHORT TERM GOAL #1   Title pt will be I with inital HEP (29-Jun-2015)   Time 2   Period Weeks   Status Achieved           PT Long Term Goals - 29-Jun-2015 1132    PT LONG TERM GOAL #1   Title pt will be I with all HEP given throughout therapy (07/05/2015)   Time 4   Period Weeks   Status Achieved   PT LONG TERM GOAL #2   Title pt will increase L ankle to > 4+/5 in all planes to assist with walking/ standing endurance (07/05/2015)   Time 4   Period Weeks   Status Achieved   PT LONG TERM GOAL #3   Title pt will be able to tolerate standing/walking for > 15 minutes to promote community ambulation (07/05/2015)   Time 4   Period Weeks   Status Achieved   PT LONG TERM GOAL #4   Title pt will increase his LEFS  score by > 10 points to demonstrate improved function at discharge (07/05/2015)   Time 4   Period Weeks   Status Achieved               Plan - 2015-06-29 1139    Clinical Impression Statement [name] reports being consistent with his HEP and reports no pain. He was able to perform all balance exercises today with out complaint of pain or instablity. He has improved mobility and strength in the L. He has met all Goals this visit. He reports that he feels like he can maintain and progress his current level independently and no longer requires physical therapy.    PT Next Visit Plan D/C   PT Home Exercise Plan standing balance in cornere   Consulted and Agree with Plan of Care Patient          G-Codes - 06/29/2015 1143    Functional Assessment Tool Used Lefs  75/80   Functional Limitation Mobility: Walking and moving around   Mobility: Walking and Moving Around Goal Status [phone]) At least 1 percent but less than 20 percent impaired, limited or restricted   Mobility: Walking and Moving Around Discharge Status [phone]) At least 1 percent but less than 20 percent impaired, limited or restricted      Problem List Patient Active Problem List   Diagnosis Date Noted  . Ankle fracture 03/23/2015  . Carotid artery stenosis 03/20/2015  . Coronary artery disease   . Pure hypercholesterolemia 08/04/2013  . Essential hypertension, benign 08/04/2013  . Encounter for long-term (current) use of other medications 08/04/2013   Starr Lake PT, DPT, LAT, ATC  06-29-2015  11:44 AM      [place], [place], [address] Phone: [phone]   Fax:  [phone]  Name: [name] [name] MRN: [id number] Date of Birth: [birth year]    PHYSICAL THERAPY DISCHARGE SUMMARY  Visits from Start of Care: 3  Current functional level related to goals / functional outcomes: LEFS 75/80   Remaining deficits: Mild weakness  with L ankle inversion, mild balance deficit.    Education / Equipment: HEP, theraband for strengthening.  Plan: Patient agrees  to discharge.  Patient goals were met. Patient is being discharged due to meeting the stated rehab goals.  ?????

## 2015-06-28 ENCOUNTER — Encounter: Payer: Medicare Other | Admitting: Physical Therapy

## 2015-07-05 ENCOUNTER — Ambulatory Visit: Payer: Medicare Other | Admitting: Physical Therapy

## 2016-03-22 ENCOUNTER — Encounter (HOSPITAL_COMMUNITY): Payer: Self-pay | Admitting: Cardiology

## 2016-04-06 ENCOUNTER — Other Ambulatory Visit: Payer: Self-pay | Admitting: Cardiology

## 2016-04-20 ENCOUNTER — Other Ambulatory Visit: Payer: Self-pay

## 2016-06-29 ENCOUNTER — Other Ambulatory Visit: Payer: Self-pay | Admitting: Cardiology

## 2016-07-24 ENCOUNTER — Telehealth: Payer: Self-pay | Admitting: Cardiology

## 2016-07-24 NOTE — Telephone Encounter (Signed)
°*  STAT* If patient is at the pharmacy, call can be transferred to refill team.   1. Which medications need to be refilled? (please list name of each medication and dose if known) Metoprolol Succinate   2. Which pharmacy/location (including street and city if local pharmacy) is medication to be sent to?[facility]   3. Do they need a 30 day or 90 day supply? [place]

## 2016-07-24 NOTE — Telephone Encounter (Signed)
Informed patient that since it has been over a year since he was evaluated, he will need an OV prior to clearance. Scheduled patient for TOMORROW, 12/12 with Dr. Radford Pax. He was grateful for call.

## 2016-07-24 NOTE — Telephone Encounter (Signed)
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 Please call   Thanks

## 2016-07-25 ENCOUNTER — Ambulatory Visit: Payer: Medicare Other | Admitting: Cardiology

## 2016-07-28 ENCOUNTER — Encounter: Payer: Self-pay | Admitting: Cardiology

## 2016-07-28 ENCOUNTER — Ambulatory Visit (INDEPENDENT_AMBULATORY_CARE_PROVIDER_SITE_OTHER): Payer: Medicare Other | Admitting: Cardiology

## 2016-07-28 DIAGNOSIS — Z951 Presence of aortocoronary bypass graft: Secondary | ICD-10-CM

## 2016-07-28 DIAGNOSIS — E785 Hyperlipidemia, unspecified: Secondary | ICD-10-CM

## 2016-07-28 DIAGNOSIS — Z23 Encounter for immunization: Secondary | ICD-10-CM | POA: Diagnosis not present

## 2016-07-28 DIAGNOSIS — Z8659 Personal history of other mental and behavioral disorders: Secondary | ICD-10-CM | POA: Diagnosis not present

## 2016-07-28 DIAGNOSIS — I10 Essential (primary) hypertension: Secondary | ICD-10-CM

## 2016-07-28 DIAGNOSIS — I6523 Occlusion and stenosis of bilateral carotid arteries: Secondary | ICD-10-CM | POA: Diagnosis not present

## 2016-07-28 DIAGNOSIS — J069 Acute upper respiratory infection, unspecified: Secondary | ICD-10-CM | POA: Diagnosis not present

## 2016-07-28 MED ORDER — METOPROLOL TARTRATE 25 MG PO TABS: ORAL_TABLET | ORAL | 3 refills | Status: DC

## 2016-07-28 NOTE — Assessment & Plan Note (Signed)
S/pCABG x 3 2007. Negative POET Jan 2015, low risk Myoview with normal LVF in Oct 2016 He has been stable, no angina or NTG use

## 2016-07-28 NOTE — Progress Notes (Signed)
07/28/2016 [name] [name]   [birth year]  DR:[id number]  Primary Physician [name] Coffin, MD Primary Cardiologist: Dr [name] [name]  HPI:  Pleasant 66 y/o male followed by Dr [name] [name] with a history of CAD, s/p CABG x 3 in 2007. He had a regular treadmill stress test in jan 2015 that was negative and a low risk Myoview study in Oct 2016. His LVF was normal. 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 He denies any chest pain or NTG use. He denies any unusual dyspnea. He denies any symptoms suspicious for sleep apnea, no history of snoring, nor daytime fatigue, and overall feels rested when he wakes and has no problems through the night and no unusual daytime fatigue.    Current Outpatient Prescriptions  Medication Sig Dispense Refill  . aspirin 81 MG tablet Take 1 tablet (81 mg total) by mouth daily.    . Calcium Carb-Cholecalciferol (CALCIUM 500 +D PO) Take 1 tablet by mouth daily.    . famotidine (PEPCID) 20 MG tablet Take 20 mg by mouth 2 (two) times daily.    . fluticasone (FLONASE) 50 MCG/ACT nasal spray Place 1 spray into both nostrils as needed for allergies or rhinitis.    Marland Kitchen loratadine (CLARITIN) 10 MG tablet Take 10 mg by mouth daily.    . metoprolol tartrate (LOPRESSOR) 25 MG tablet TAKE 0.5 TABLETS (12.5 MG TOTAL) BY MOUTH 2 (TWO) TIMES DAILY. 60 tablet 3  . Omega-3 Fatty Acids (FISH OIL) 1000 MG CAPS Take 1 capsule by mouth 2 (two) times daily.    . rosuvastatin (CRESTOR) 40 MG tablet Take 1 tablet (40 mg total) by mouth daily. 90 tablet 0  . vitamin C (ASCORBIC ACID) 500 MG tablet Take 500 mg by mouth daily.     No current facility-administered medications for this visit.     Allergies  Allergen Reactions  . Penicillins Rash    Social History   Social History  . Marital status: Married    Spouse name: N/A  . Number of children: N/A  . Years of education: N/A    Occupational History  . Not on file.   Social History Main Topics  . Smoking status: Former Smoker    Quit date: 08/14/1988  . Smokeless tobacco: Not on file  . Alcohol use Yes     Comment: 5 per week  . Drug use: No  . Sexual activity: Not on file   Other Topics Concern  . Not on file   Social History Narrative  . No narrative on file     Review of Systems: General: negative for chills, fever, night sweats or weight changes.  Cardiovascular: negative for chest pain, dyspnea on exertion, edema, orthopnea, palpitations, paroxysmal nocturnal dyspnea or shortness of breath Dermatological: negative for rash Respiratory: negative for cough or wheezing Urologic: negative for hematuria Abdominal: negative for nausea, vomiting, diarrhea, bright red blood per rectum, melena, or hematemesis Neurologic: negative for visual changes, syncope, or dizziness All other systems reviewed and are otherwise negative except as noted above.    Blood pressure (!) 148/84, pulse 65, height 5\' 8"  (1.727 m), weight 221 lb 3.2 oz (100.3 kg), SpO2 97 %.  General appearance: alert, cooperative, no distress and mildly obese Neck: no carotid bruit and no JVD Lungs: clear to auscultation bilaterally Heart: regular rate and rhythm Abdomen: soft, non-tender; bowel sounds normal; no  masses,  no organomegaly Extremities: extremities normal, atraumatic, no cyanosis or edema Pulses: 2+ and symmetric Skin: Skin color, texture, turgor normal. No rashes or lesions Neurologic: Grossly normal  EKG [id number]  ASSESSMENT AND PLAN:   Essential hypertension, benign Controlled  Carotid artery stenosis 1-39% bilateral on dopplers 03/2015  Dyslipidemia On high dose statin Rx  Hx of CABG S/pCABG x 3 2007. Negative POET Jan 2015, low risk Myoview with normal LVF in Oct 2016 He has been stable, no angina or NTG use   PLAN  From a cardiac standpoint [name] [name] has been stable, no angina, dyspnea, syncope, or  evidence of sleep apnea. His stress test in Oct 2016 was low risk and his LVF was normal. He recently ran out of his Lopressor but a refill has been ordered, he will pick it up today. When he is on this he says his B/P runs close to 120/80. Dr [name] [name] can f/u his B/P when she sees him next month. 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 Dr [name] [name] will see him in a month. He is due for lipids and CMET then.   Kerin Ransom PA-C 07/28/2016 4:06 PM

## 2016-07-28 NOTE — Assessment & Plan Note (Signed)
Controlled.  

## 2016-07-28 NOTE — Assessment & Plan Note (Signed)
On high dose statin Rx 

## 2016-07-28 NOTE — Addendum Note (Signed)
Addended by: Waylan Rocher on: 07/28/2016 04:17 PM   Modules accepted: Orders

## 2016-07-28 NOTE — Assessment & Plan Note (Signed)
1-39% bilateral on dopplers 03/2015

## 2016-07-29 ENCOUNTER — Other Ambulatory Visit: Payer: Self-pay | Admitting: Cardiology

## 2016-07-29 ENCOUNTER — Other Ambulatory Visit: Payer: Self-pay | Admitting: Physician Assistant

## 2016-07-29 MED ORDER — METOPROLOL SUCCINATE ER 25 MG PO TB24: 25.0000 mg | ORAL_TABLET | Freq: Every day | ORAL | 3 refills | Status: DC

## 2016-07-31 NOTE — Telephone Encounter (Signed)
Please advise as patient has metoprolol tartrate and metoprolol succinate listed on current med list. One of which was ordered on 07/28/16 and one on 07/29/16. Thanks, MI

## 2016-08-02 ENCOUNTER — Other Ambulatory Visit: Payer: Self-pay | Admitting: *Deleted

## 2016-08-12 DIAGNOSIS — Z743 Need for continuous supervision: Secondary | ICD-10-CM | POA: Diagnosis not present

## 2016-08-12 DIAGNOSIS — S4982XA Other specified injuries of left shoulder and upper arm, initial encounter: Secondary | ICD-10-CM | POA: Diagnosis not present

## 2016-08-28 ENCOUNTER — Encounter (INDEPENDENT_AMBULATORY_CARE_PROVIDER_SITE_OTHER): Payer: Self-pay

## 2016-08-28 ENCOUNTER — Encounter: Payer: Self-pay | Admitting: Cardiology

## 2016-08-28 ENCOUNTER — Ambulatory Visit (INDEPENDENT_AMBULATORY_CARE_PROVIDER_SITE_OTHER): Payer: Medicare Other | Admitting: Cardiology

## 2016-08-28 VITALS — BP 132/70 | HR 68 | Ht 68.0 in | Wt 225.0 lb

## 2016-08-28 DIAGNOSIS — I6523 Occlusion and stenosis of bilateral carotid arteries: Secondary | ICD-10-CM | POA: Diagnosis not present

## 2016-08-28 DIAGNOSIS — I10 Essential (primary) hypertension: Secondary | ICD-10-CM

## 2016-08-28 DIAGNOSIS — E785 Hyperlipidemia, unspecified: Secondary | ICD-10-CM

## 2016-08-28 DIAGNOSIS — I2510 Atherosclerotic heart disease of native coronary artery without angina pectoris: Secondary | ICD-10-CM | POA: Diagnosis not present

## 2016-08-28 HISTORY — DX: Atherosclerotic heart disease of native coronary artery without angina pectoris: I25.10

## 2016-08-28 NOTE — Progress Notes (Signed)
Cardiology Office Note    Date:  08/28/2016   ID:  [name] [name], DOB 1951/02/11, MRN [id number]  PCP:  Donnie Coffin, MD  Cardiologist:  Fransico Him, MD   Chief Complaint  Patient presents with  . Coronary Artery Disease  . Hypertension  . Hyperlipidemia    History of Present Illness:  [name] [name] is a 66 y.o. male with a history of ASCAD, HTN and dyslipidemia. He is doing well. He denies any anginal chest pain, SOB, DOE (except with extreme exertion), PND, orthopnea, dizziness, Claudication, LE edema, palpitations or syncope.    Past Medical History:  Diagnosis Date  . Bilateral carotid artery stenosis    Mild  . CAD (coronary artery disease), native coronary artery 08/28/2016  . Carotid artery stenosis 03/20/2015   1-39% bilateral on dopplers 03/2015  . Coronary artery disease    s/pCABG  . Depression    Dr Charissa Bash  . Dyslipidemia   . Hypertension   . Melanoma ([place]) 2010   of the scalp Dr Manuela Neptune    Past Surgical History:  Procedure Laterality Date  . CORONARY ARTERY BYPASS GRAFT  2007  . FRACTURE SURGERY     ankle fracture aug 2016  . ORIF ANKLE FRACTURE Left 03/23/2015   Procedure: OPEN REDUCTION INTERNAL FIXATION (ORIF) ANKLE FRACTURE;  Surgeon: Marybelle Killings, MD;  Location: [facility];  Service: Orthopedics;  Laterality: Left;    Current Medications: Outpatient Medications Prior to Visit  Medication Sig Dispense Refill  . aspirin 81 MG tablet Take 1 tablet (81 mg total) by mouth daily.    . Calcium Carb-Cholecalciferol (CALCIUM 500 +D PO) Take 1 tablet by mouth daily.    . famotidine (PEPCID) 20 MG tablet Take 20 mg by mouth 2 (two) times daily.    . fluticasone (FLONASE) 50 MCG/ACT nasal spray Place 1 spray into both nostrils as needed for allergies or rhinitis.    Marland Kitchen loratadine (CLARITIN) 10 MG tablet Take 10 mg by mouth daily.    . Omega-3 Fatty Acids (FISH OIL) 1000 MG CAPS Take 1 capsule by mouth 2 (two) times daily.    . rosuvastatin (CRESTOR)  40 MG tablet Take 1 tablet (40 mg total) by mouth daily. 90 tablet 0  . vitamin C (ASCORBIC ACID) 500 MG tablet Take 500 mg by mouth daily.    . metoprolol tartrate (LOPRESSOR) 25 MG tablet TAKE 0.5 TABLETS (12.5 MG TOTAL) BY MOUTH 2 (TWO) TIMES DAILY. 30 tablet 11   No facility-administered medications prior to visit.      Allergies:   Penicillins   Social History   Social History  . Marital status: Married    Spouse name: N/A  . Number of children: N/A  . Years of education: N/A   Social History Main Topics  . Smoking status: Former Smoker    Quit date: 08/14/1988  . Smokeless tobacco: None  . Alcohol use Yes     Comment: 5 per week  . Drug use: No  . Sexual activity: Not Asked   Other Topics Concern  . None   Social History Narrative  . None     Family History:  The patient's family history includes Heart disease in his father.   ROS:   Please see the history of present illness.    ROS All other systems reviewed and are negative.  No flowsheet data found.     PHYSICAL EXAM:   VS:  BP 132/70   Pulse 68  Ht 5\' 8"  (1.727 m)   Wt 225 lb (102.1 kg)   BMI 34.21 kg/m    GEN: Well nourished, well developed, in no acute distress  HEENT: normal  Neck: no JVD, carotid bruits, or masses Cardiac: RRR; no murmurs, rubs, or gallops,no edema.  Intact distal pulses bilaterally.  Respiratory:  clear to auscultation bilaterally, normal work of breathing GI: soft, nontender, nondistended, + BS MS: no deformity or atrophy  Skin: warm and dry, no rash Neuro:  Alert and Oriented x 3, Strength and sensation are intact Psych: euthymic mood, full affect  Wt Readings from Last 3 Encounters:  08/28/16 225 lb (102.1 kg)  07/28/16 221 lb 3.2 oz (100.3 kg)  05/17/15 223 lb (101.2 kg)      Studies/Labs Reviewed:   EKG:  EKG is not ordered today.    Recent Labs: No results found for requested labs within last 8760 hours.   Lipid Panel    Component Value Date/Time    CHOL 173 01/12/2015 1219   TRIG 167.0 (H) 01/12/2015 1219   HDL 39.20 01/12/2015 1219   CHOLHDL 4 01/12/2015 1219   VLDL 33.4 01/12/2015 1219   LDLCALC 100 (H) 01/12/2015 1219    Additional studies/ records that were reviewed today include:  none    ASSESSMENT:    1. Coronary artery disease involving native coronary artery of native heart without angina pectoris   2. Essential hypertension, benign   3. Bilateral carotid artery stenosis   4. Dyslipidemia      PLAN:  In order of problems listed above:  1. ASCAD s/p remote CABG.  He has no anginal symptoms.  Continue ASA/BB/statin 2. HTN - BP controlled on current meds.  Continue BB. 3. Bilateral carotid artery stenosis (1-39%) - repeat dopplers 03/2017.  Continue ASA and statin.   4. Dyslipidemia - LDL goal < 70.  Continue statin. Check FLP and ALT.    Medication Adjustments/Labs and Tests Ordered: Current medicines are reviewed at length with the patient today.  Concerns regarding medicines are outlined above.  Medication changes, Labs and Tests ordered today are listed in the Patient Instructions below.  There are no Patient Instructions on file for this visit.   Signed, Fransico Him, MD  08/28/2016 8:14 AM    [facility] [address], [place], [place]  [address] Phone: [phone]; Fax: [phone]

## 2016-08-28 NOTE — Patient Instructions (Signed)
Medication Instructions:  Your physician recommends that you continue on your current medications as directed. Please refer to the Current Medication list given to you today.   Labwork: 1. FASTING LIPID AND LIVER PANEL TO BE DONE IN 1 WEEK  Testing/Procedures: 1. Your physician has requested that you have a carotid duplex. This test is an ultrasound of the carotid arteries in your neck. It looks at blood flow through these arteries that supply the brain with blood. Allow one hour for this exam. There are no restrictions or special instructions. THIS IS TO BE DONE IN 03/2017  2. Your physician has requested that you have an exercise tolerance test. For further information please visit [facility]uge[facility]iest[facility][email or web address]. Please also follow instruction sheet, as given. THIS IS TO BE DONE IN 06/2017 1 WEEK BEFORE APPT WITH DR. Radford Pax    Follow-Up: 06/2017 WITH DR. Radford Pax  Any Other Special Instructions Will Be Listed Below (If Applicable).     If you need a refill on your cardiac medications before your next appointment, please call your pharmacy.

## 2016-09-04 ENCOUNTER — Ambulatory Visit: Payer: Medicare Other | Admitting: Cardiology

## 2016-09-11 ENCOUNTER — Other Ambulatory Visit: Payer: Medicare Other | Admitting: *Deleted

## 2016-09-11 DIAGNOSIS — E785 Hyperlipidemia, unspecified: Secondary | ICD-10-CM

## 2016-09-12 LAB — LIPID PANEL
Chol/HDL Ratio: 3.7 ratio units (ref 0.0–5.0)
Cholesterol, Total: 158 mg/dL (ref 100–199)
HDL: 43 mg/dL (ref >39)
LDL Calculated: 93 mg/dL (ref 0–99)
Triglycerides: 112 mg/dL (ref 0–149)
VLDL Cholesterol Cal: 22 mg/dL (ref 5–40)

## 2016-09-12 LAB — HEPATIC FUNCTION PANEL
ALT: 17 IU/L (ref 0–44)
AST: 17 IU/L (ref 0–40)
Albumin: 4.2 g/dL (ref 3.6–4.8)
Alkaline Phosphatase: 46 IU/L (ref 39–117)
BILIRUBIN TOTAL: 0.5 mg/dL (ref 0.0–1.2)
Bilirubin, Direct: 0.13 mg/dL (ref 0.00–0.40)
TOTAL PROTEIN: 7 g/dL (ref 6.0–8.5)

## 2016-09-19 ENCOUNTER — Telehealth: Payer: Self-pay | Admitting: Cardiology

## 2016-09-19 DIAGNOSIS — E785 Hyperlipidemia, unspecified: Secondary | ICD-10-CM

## 2016-09-19 NOTE — Telephone Encounter (Signed)
Follow Up    Pt returning phone call from [facility]. Per pt best time to reach her is first thing in the morning, and early afternoon.

## 2016-09-20 MED ORDER — EZETIMIBE 10 MG PO TABS: 10.0000 mg | ORAL_TABLET | Freq: Every day | ORAL | 11 refills | Status: DC

## 2016-09-20 NOTE — Telephone Encounter (Signed)
Informed patient of results and verbal understanding expressed.  Instructed patient to START ZETIA 10 mg daily. FLP and ALT scheduled 4/9.  Patient agrees with treatment plan.    Medication called in to [facility].  Patient states he wants [facility]. Spoke with Ricarda Frame, [place] at [place] and cancelled new prescription. She will flag the order so medication is not sent to patient.

## 2016-09-20 NOTE — Telephone Encounter (Signed)
Attempted to call patient - phone cut off and there was no answer.  Will try again later.

## 2016-09-20 NOTE — Telephone Encounter (Signed)
-----   Message from Sueanne Margarita, MD sent at 09/13/2016 10:00 AM EST ----- LDL not at goal - add Zetia 10mg  daily and repeat FLP and ALT in 6 weeks

## 2016-09-27 ENCOUNTER — Other Ambulatory Visit: Payer: Self-pay | Admitting: Cardiology

## 2016-11-20 ENCOUNTER — Other Ambulatory Visit (INDEPENDENT_AMBULATORY_CARE_PROVIDER_SITE_OTHER): Payer: Medicare Other

## 2016-11-20 DIAGNOSIS — E785 Hyperlipidemia, unspecified: Secondary | ICD-10-CM

## 2016-11-20 NOTE — Addendum Note (Signed)
Addended by: Stephannie Peters on: 11/20/2016 03:44 PM   Modules accepted: Orders

## 2016-11-20 NOTE — Addendum Note (Signed)
Addended by: Stephannie Peters on: 11/20/2016 03:43 PM   Modules accepted: Orders

## 2016-11-21 LAB — LIPID PANEL
CHOL/HDL RATIO: 3.1 ratio (ref 0.0–5.0)
Cholesterol, Total: 124 mg/dL (ref 100–199)
HDL: 40 mg/dL (ref >39)
LDL CALC: 52 mg/dL (ref 0–99)
Triglycerides: 160 mg/dL — ABNORMAL HIGH (ref 0–149)
VLDL Cholesterol Cal: 32 mg/dL (ref 5–40)

## 2016-11-21 LAB — ALT: ALT: 22 IU/L (ref 0–44)

## 2017-03-26 ENCOUNTER — Inpatient Hospital Stay (HOSPITAL_COMMUNITY): Admission: RE | Admit: 2017-03-26 | Payer: Medicare Other | Source: Ambulatory Visit

## 2017-04-02 ENCOUNTER — Ambulatory Visit (HOSPITAL_COMMUNITY)
Admission: RE | Admit: 2017-04-02 | Discharge: 2017-04-02 | Disposition: A | Discharge status: Home / Self Care | Payer: Medicare Other | Source: Ambulatory Visit | Attending: Cardiology | Admitting: Cardiology

## 2017-04-02 DIAGNOSIS — I6523 Occlusion and stenosis of bilateral carotid arteries: Secondary | ICD-10-CM | POA: Diagnosis not present

## 2017-04-03 ENCOUNTER — Encounter: Payer: Self-pay | Admitting: Cardiology

## 2017-06-18 ENCOUNTER — Other Ambulatory Visit: Payer: Self-pay | Admitting: Family Medicine

## 2017-06-18 DIAGNOSIS — R002 Palpitations: Secondary | ICD-10-CM

## 2017-07-16 ENCOUNTER — Ambulatory Visit (INDEPENDENT_AMBULATORY_CARE_PROVIDER_SITE_OTHER): Payer: Medicare Other

## 2017-07-16 ENCOUNTER — Encounter (INDEPENDENT_AMBULATORY_CARE_PROVIDER_SITE_OTHER): Payer: Self-pay

## 2017-07-16 ENCOUNTER — Telehealth: Payer: Self-pay | Admitting: *Deleted

## 2017-07-16 DIAGNOSIS — I2510 Atherosclerotic heart disease of native coronary artery without angina pectoris: Secondary | ICD-10-CM | POA: Diagnosis not present

## 2017-07-16 LAB — EXERCISE TOLERANCE TEST
CHL CUP MPHR: 153 {beats}/min
CSEPEDS: 1 s
CSEPPHR: 151 {beats}/min
Estimated workload: 8.5 METS
Exercise duration (min): 7 min
Percent HR: 99 %
RPE: 18
Rest HR: 69 {beats}/min

## 2017-07-16 NOTE — Telephone Encounter (Signed)
Message  Received: Today  Message Contents  Dorothy Spark, MD  Nuala Alpha, LPN        This patient has abnormal treadmill stress test and history of remote bypasses, please schedule an appointment with a PA as soon as possible to discuss cardiac catheterization.   Thank you,   Ena Dawley     Called the pt and endorsed recommendations per DOD Dr. Meda Coffee.  Informed the pt that per Dr Meda Coffee, with his abnormal stress test results, and remote bypasses, we need to bring him into see an Extender, to discuss  cardiac catheterization. Pt requested an appt with an Extender for tomorrow, 12/4. Scheduled the pt to see Vin Bhagat PA-C for 12/4 at 2 pm.  Advised the pt to arrive 15 mins prior to that appt.  Pt verbalized understanding and agrees with this plan.  Pt more than gracious for all the assistance provided.  Will forward this message to both Dr. Radford Pax and Dr. Meda Coffee, as an Juluis Rainier.

## 2017-07-17 ENCOUNTER — Encounter: Payer: Self-pay | Admitting: Physician Assistant

## 2017-07-17 ENCOUNTER — Encounter: Payer: Self-pay | Admitting: *Deleted

## 2017-07-17 ENCOUNTER — Ambulatory Visit (INDEPENDENT_AMBULATORY_CARE_PROVIDER_SITE_OTHER): Payer: Medicare Other | Admitting: Physician Assistant

## 2017-07-17 VITALS — BP 138/86 | HR 60 | Ht 68.0 in | Wt 217.1 lb

## 2017-07-17 DIAGNOSIS — R9439 Abnormal result of other cardiovascular function study: Secondary | ICD-10-CM | POA: Diagnosis not present

## 2017-07-17 DIAGNOSIS — I6523 Occlusion and stenosis of bilateral carotid arteries: Secondary | ICD-10-CM

## 2017-07-17 DIAGNOSIS — E785 Hyperlipidemia, unspecified: Secondary | ICD-10-CM | POA: Diagnosis not present

## 2017-07-17 DIAGNOSIS — I25708 Atherosclerosis of coronary artery bypass graft(s), unspecified, with other forms of angina pectoris: Secondary | ICD-10-CM

## 2017-07-17 DIAGNOSIS — Z01812 Encounter for preprocedural laboratory examination: Secondary | ICD-10-CM

## 2017-07-17 DIAGNOSIS — I10 Essential (primary) hypertension: Secondary | ICD-10-CM | POA: Diagnosis not present

## 2017-07-17 DIAGNOSIS — I209 Angina pectoris, unspecified: Secondary | ICD-10-CM

## 2017-07-17 NOTE — Patient Instructions (Addendum)
Medication Instructions:  Your physician recommends that you continue on your current medications as directed. Please refer to the Current Medication list given to you today.  * If you need a refill on your cardiac medications before your next appointment, please call your pharmacy. *  Labwork: Pre procedure labs today: BMET, CBC w/ diff & INR  Testing/Procedures: Your physician has requested that you have a cardiac catheterization. Cardiac catheterization is used to diagnose and/or treat various heart conditions. Doctors may recommend this procedure for a number of different reasons. The most common reason is to evaluate chest pain. Chest pain can be a symptom of coronary artery disease (CAD), and cardiac catheterization can show whether plaque is narrowing or blocking your heart's arteries. This procedure is also used to evaluate the valves, as well as measure the blood flow and oxygen levels in different parts of your heart. For further information please visit [facility]iesta[facility][email or web address]. Please follow instruction sheet, as given.  Follow-Up: To be determined after heart catheterization  Thank you for choosing [facility]!!    Any Other Special Instructions Will Be Listed Below (If Applicable).    Coronary Angiogram A coronary angiogram is an X-ray procedure that is used to examine the arteries in the heart. In this procedure, a dye (contrast dye) is injected through a long, thin tube (catheter). The catheter is inserted through the groin, wrist, or arm. The dye is injected into each artery, then X-rays are taken to show if there is a blockage in the arteries of the heart. This procedure can also show if you have valve disease or a disease of the aorta, and it can be used to check the overall function of your heart muscle. You may have a coronary angiogram if:  You are having chest pain, or other symptoms of angina, and you are at risk for heart disease.  You have an abnormal  electrocardiogram (ECG) or stress test.  You have chest pain and heart failure.  You are having irregular heart rhythms.  You and your health care provider determine that the benefits of the test information outweigh the risks of the procedure.  Let your health care provider know about:  Any allergies you have, including allergies to contrast dye.  All medicines you are taking, including vitamins, herbs, eye drops, creams, and over-the-counter medicines.  Any problems you or family members have had with anesthetic medicines.  Any blood disorders you have.  Any surgeries you have had.  History of kidney problems or kidney failure.  Any medical conditions you have.  Whether you are pregnant or may be pregnant. What are the risks? Generally, this is a safe procedure. However, problems may occur, including:  Infection.  Allergic reaction to medicines or dyes that are used.  Bleeding from the access site or other locations.  Kidney injury, especially in people with impaired kidney function.  Stroke (rare).  Heart attack (rare).  Damage to other structures or organs.  What happens before the procedure? Staying hydrated Follow instructions from your health care provider about hydration, which may include:  Up to 2 hours before the procedure - you may continue to drink clear liquids, such as water, clear fruit juice, black coffee, and plain tea.  Eating and drinking restrictions Follow instructions from your health care provider about eating and drinking, which may include:  8 hours before the procedure - stop eating heavy meals or foods such as meat, fried foods, or fatty foods.  6 hours before the procedure - stop  eating light meals or foods, such as toast or cereal.  2 hours before the procedure - stop drinking clear liquids.  General instructions  Ask your health care provider about: ? Changing or stopping your regular medicines. This is especially important if  you are taking diabetes medicines or blood thinners. ? Taking medicines such as ibuprofen. These medicines can thin your blood. Do not take these medicines before your procedure if your health care provider instructs you not to, though aspirin may be recommended prior to coronary angiograms.  Plan to have someone take you home from the hospital or clinic.  You may need to have blood tests or X-rays done. What happens during the procedure?  An IV tube will be inserted into one of your veins.  You will be given one or more of the following: ? A medicine to help you relax (sedative). ? A medicine to numb the area where the catheter will be inserted into an artery (local anesthetic).  To reduce your risk of infection: ? Your health care team will wash or sanitize their hands. ? Your skin will be washed with soap. ? Hair may be removed from the area where the catheter will be inserted.  You will be connected to a continuous ECG monitor.  The catheter will be inserted into an artery. The location may be in your groin, in your wrist, or in the fold of your arm (near your elbow).  A type of X-ray (fluoroscopy) will be used to help guide the catheter to the opening of the blood vessel that is being examined.  A dye will be injected into the catheter, and X-rays will be taken. The dye will help to show where any narrowing or blockages are located in the heart arteries.  Tell your health care provider if you have any chest pain or trouble breathing during the procedure.  If blockages are found, your health care provider may perform another procedure, such as inserting a coronary stent. The procedure may vary among health care providers and hospitals. What happens after the procedure?  After the procedure, you will need to keep the area still for a few hours, or for as long as told by your health care provider. If the procedure is done through the groin, you will be instructed to not bend and  not cross your legs.  The insertion site will be checked frequently.  The pulse in your foot or wrist will be checked frequently.  You may have additional blood tests, X-rays, and a test that records the electrical activity of your heart (ECG).  Do not drive for 24 hours if you were given a sedative. Summary  A coronary angiogram is an X-ray procedure that is used to look into the arteries in the heart.  During the procedure, a dye (contrast dye) is injected through a long, thin tube (catheter). The catheter is inserted through the groin, wrist, or arm.  Tell your health care provider about any allergies you have, including allergies to contrast dye.  After the procedure, you will need to keep the area still for a few hours, or for as long as told by your health care provider. This information is not intended to replace advice given to you by your health care provider. Make sure you discuss any questions you have with your health care provider. Document Released: 02/04/2003 Document Revised: 05/12/2016 Document Reviewed: 05/12/2016 Elsevier Interactive Patient Education  [employer].

## 2017-07-17 NOTE — Addendum Note (Signed)
Addended by: Stanton Kidney on: 07/17/2017 02:38 PM   Modules accepted: Orders

## 2017-07-17 NOTE — Progress Notes (Signed)
Cardiology Office Note    Date:  07/17/2017   ID:  [name] [name], DOB 1951/05/16, MRN [id number]  PCP:  Aurea Graff.Marlou Sa, MD  Cardiologist: Dr. Radford Pax  Chief Complaint: Abnormal stress test  History of Present Illness:   [name] [name] is a 66 y.o. male with history of CAD status post CABG s/p CABG x 3 in 2007, hypertension, bilateral carotid artery stenosis and hyperlipidemia presents for abnormal stress test.  He had a regular treadmill stress test in jan 2015 that was negative and a low risk Myoview study in Oct 2016. His LVF was normal.   He was doing well on cardiac standpoint when last seen by Dr. Radford Pax January 2018.  ETT 07/16/17 is abnormal as below and added to my schedule.  Blood pressure demonstrated a hypertensive response to exercise.  Upsloping ST segment depression ST segment depression of 1 mm was noted during stress in the II, V5, V6 and III leads, beginning at 3 minutes of stress.  Good   There are upsloping ST depressions that start at 3 minutes into the exercise.  They get slightly better with recovery, however 2 minutes into recovery he develops more significant 2 mm downsloping ST depressions that eventually resolve 8 minutes into recovery.  The patient has normal functional capacity and was asymptomatic during the test.  This is an abnormal test consistent with ischemia.  Added to my schedule for further discussion.  He is a Administrator.  No regular exercise or walking.  He denies any chest pain, shortness of breath, orthopnea, PND, syncope, lower extremity edema or melena.  Compliant with medication.  No heart healthy diet.  He did not had any anginal symptoms prior to his CABG.  Past Medical History:  Diagnosis Date  . Bilateral carotid artery stenosis    1-39% by dopplers 03/2017  . CAD (coronary artery disease), native coronary artery 08/28/2016   s/p CABG  . Depression    Dr Charissa Bash  . Dyslipidemia   . Hypertension   . Melanoma ([place])  2010   of the scalp Dr Manuela Neptune    Past Surgical History:  Procedure Laterality Date  . CORONARY ARTERY BYPASS GRAFT  2007  . FRACTURE SURGERY     ankle fracture aug 2016  . ORIF ANKLE FRACTURE Left 03/23/2015   Procedure: OPEN REDUCTION INTERNAL FIXATION (ORIF) ANKLE FRACTURE;  Surgeon: Marybelle Killings, MD;  Location: [facility];  Service: Orthopedics;  Laterality: Left;    Current Medications: Prior to Admission medications   Medication Sig Start Date End Date Taking? Authorizing Provider  aspirin 81 MG tablet Take 1 tablet (81 mg total) by mouth daily. 02/08/15   Sueanne Margarita, MD  Calcium Carb-Cholecalciferol (CALCIUM 500 +D PO) Take 1 tablet by mouth daily.    [provider]  ezetimibe (ZETIA) 10 MG tablet Take 1 tablet (10 mg total) by mouth daily. 09/20/16 09/15/17  Sueanne Margarita, MD  famotidine (PEPCID) 20 MG tablet Take 20 mg by mouth 2 (two) times daily.    [provider]  fluticasone (FLONASE) 50 MCG/ACT nasal spray Place 1 spray into both nostrils as needed for allergies artery stenosis rhinitis.    [provider]  loratadine (CLARITIN) 10 MG tablet Take 10 mg by mouth daily.    [provider]  metoprolol succinate (TOPROL-XL) 25 MG 24 hr tablet Take 25 mg by mouth daily. 07/29/16   [provider]  Omega-3 Fatty Acids (FISH OIL) 1000 MG CAPS  Take 1 capsule by mouth 2 (two) times daily.    [provider]  rosuvastatin (CRESTOR) 40 MG tablet TAKE 1 TABLET DAILY. PLEASEKEEP UPCOMING APPOINTMENT  FOR FURTHER REFILLS. 09/28/16   Sueanne Margarita, MD  vitamin C (ASCORBIC ACID) 500 MG tablet Take 500 mg by mouth daily.    [provider]    Allergies:   Penicillins   Social History   Socioeconomic History  . Marital status: Married    Spouse name: None  . Number of children: None  . Years of education: None  . Highest education level: None  Social Needs  . Financial resource strain: None  . Food insecurity -  worry: None  . Food insecurity - inability: None  . Transportation needs - medical: None  . Transportation needs - non-medical: None  Occupational History  . None  Tobacco Use  . Smoking status: Former Smoker    Last attempt to quit: 08/14/1988    Years since quitting: 28.9  . Smokeless tobacco: Never Used  Substance and Sexual Activity  . Alcohol use: Yes    Comment: 5 per week  . Drug use: No  . Sexual activity: None  Other Topics Concern  . None  Social History Narrative  . None     Family History:  The patient's family history includes Heart disease in his father.  ROS:   Please see the history of present illness.    ROS All other systems reviewed and are negative.   PHYSICAL EXAM:   VS:  BP 138/86   Pulse 60   Ht 5\' 8"  (1.727 m)   Wt 217 lb 1.9 oz (98.5 kg)   BMI 33.01 kg/m    GEN: Well nourished, well developed, in no acute distress  HEENT: normal  Neck: no JVD, carotid bruits, or masses Cardiac: RRR; no murmurs, rubs, or gallops, no edema  Respiratory:  clear to auscultation bilaterally, normal work of breathing GI: soft, nontender, nondistended, + BS MS: no deformity or atrophy  Skin: warm and dry, no rash Neuro:  Alert and Oriented x 3, Strength and sensation are intact Psych: euthymic mood, full affect  Wt Readings from Last 3 Encounters:  07/17/17 217 lb 1.9 oz (98.5 kg)  08/28/16 225 lb (102.1 kg)  07/28/16 221 lb 3.2 oz (100.3 kg)      Studies/Labs Reviewed:   EKG:  EKG is ordered today.  The ekg ordered today demonstrates normal sinus rhythm  Recent Labs: 11/20/2016: ALT 22   Lipid Panel    Component Value Date/Time   CHOL 124 11/20/2016 1541   TRIG 160 (H) 11/20/2016 1541   HDL 40 11/20/2016 1541   CHOLHDL 3.1 11/20/2016 1541   CHOLHDL 4 01/12/2015 1219   VLDL 33.4 01/12/2015 1219   LDLCALC 52 11/20/2016 1541    Additional studies/ records that were reviewed today include:  As above   ASSESSMENT & PLAN:    1. Abnormal stress  test with hx of CAD s/p CABG -No chest pain or shortness of breath.  Continue aspirin, beta-blocker and statin. The patient understands that risks include but are not limited to stroke (1 in 1000), death (1 in 10), kidney failure [usually temporary] (1 in 500), bleeding (1 in 200), allergic reaction [possibly serious] (1 in 200), and agrees to proceed.   2. HTN - stable and well controlled on current medications  3. HLD - 11/20/2016: Cholesterol, Total 124; HDL 40; LDL Calculated 52; Triglycerides 160  - Continue statin  4. Bilateral carotid artery stenosis - Recent doppler 03/2017 showed 1-39% bilateral carotid stenosis - continue statin and ASA    Medication Adjustments/Labs and Tests Ordered: Current medicines are reviewed at length with the patient today.  Concerns regarding medicines are outlined above.  Medication changes, Labs and Tests ordered today are listed in the Patient Instructions below. Patient Instructions  Medication Instructions:  Your physician recommends that you continue on your current medications as directed. Please refer to the Current Medication list given to you today.  * If you need a refill on your cardiac medications before your next appointment, please call your pharmacy. *  Labwork: Pre procedure labs today: BMET & CBC w/ diff  Testing/Procedures: Your physician has requested that you have a cardiac catheterization. Cardiac catheterization is used to diagnose and/or treat various heart conditions. Doctors may recommend this procedure for a number of different reasons. The most common reason is to evaluate chest pain. Chest pain can be a symptom of coronary artery disease (CAD), and cardiac catheterization can show whether plaque is narrowing or blocking your heart's arteries. This procedure is also used to evaluate the valves, as well as measure the blood flow and oxygen levels in different parts of your heart. For further information please visit  [email or web address]. Please follow instruction sheet, as given.  Follow-Up: To be determined after heart catheterization  Thank you for choosing [facility]!!    Any Other Special Instructions Will Be Listed Below (If Applicable).    Coronary Angiogram A coronary angiogram is an X-ray procedure that is used to examine the arteries in the heart. In this procedure, a dye (contrast dye) is injected through a long, thin tube (catheter). The catheter is inserted through the groin, wrist, or arm. The dye is injected into each artery, then X-rays are taken to show if there is a blockage in the arteries of the heart. This procedure can also show if you have valve disease or a disease of the aorta, and it can be used to check the overall function of your heart muscle. You may have a coronary angiogram if:  You are having chest pain, or other symptoms of angina, and you are at risk for heart disease.  You have an abnormal electrocardiogram (ECG) or stress test.  You have chest pain and heart failure.  You are having irregular heart rhythms.  You and your health care provider determine that the benefits of the test information outweigh the risks of the procedure.  Let your health care provider know about:  Any allergies you have, including allergies to contrast dye.  All medicines you are taking, including vitamins, herbs, eye drops, creams, and over-the-counter medicines.  Any problems you or family members have had with anesthetic medicines.  Any blood disorders you have.  Any surgeries you have had.  History of kidney problems or kidney failure.  Any medical conditions you have.  Whether you are pregnant or may be pregnant. What are the risks? Generally, this is a safe procedure. However, problems may occur, including:  Infection.  Allergic reaction to medicines or dyes that are used.  Bleeding from the access site or other locations.  Kidney injury, especially in  people with impaired kidney function.  Stroke (rare).  Heart attack (rare).  Damage to other structures or organs.  What happens before the procedure? Staying hydrated Follow instructions from your health care provider about hydration, which may include:  Up to 2 hours before the procedure - you may continue to  drink clear liquids, such as water, clear fruit juice, black coffee, and plain tea.  Eating and drinking restrictions Follow instructions from your health care provider about eating and drinking, which may include:  8 hours before the procedure - stop eating heavy meals or foods such as meat, fried foods, or fatty foods.  6 hours before the procedure - stop eating light meals or foods, such as toast or cereal.  2 hours before the procedure - stop drinking clear liquids.  General instructions  Ask your health care provider about: ? Changing or stopping your regular medicines. This is especially important if you are taking diabetes medicines or blood thinners. ? Taking medicines such as ibuprofen. These medicines can thin your blood. Do not take these medicines before your procedure if your health care provider instructs you not to, though aspirin may be recommended prior to coronary angiograms.  Plan to have someone take you home from the hospital or clinic.  You may need to have blood tests or X-rays done. What happens during the procedure?  An IV tube will be inserted into one of your veins.  You will be given one or more of the following: ? A medicine to help you relax (sedative). ? A medicine to numb the area where the catheter will be inserted into an artery (local anesthetic).  To reduce your risk of infection: ? Your health care team will wash or sanitize their hands. ? Your skin will be washed with soap. ? Hair may be removed from the area where the catheter will be inserted.  You will be connected to a continuous ECG monitor.  The catheter will be  inserted into an artery. The location may be in your groin, in your wrist, or in the fold of your arm (near your elbow).  A type of X-ray (fluoroscopy) will be used to help guide the catheter to the opening of the blood vessel that is being examined.  A dye will be injected into the catheter, and X-rays will be taken. The dye will help to show where any narrowing or blockages are located in the heart arteries.  Tell your health care provider if you have any chest pain or trouble breathing during the procedure.  If blockages are found, your health care provider may perform another procedure, such as inserting a coronary stent. The procedure may vary among health care providers and hospitals. What happens after the procedure?  After the procedure, you will need to keep the area still for a few hours, or for as long as told by your health care provider. If the procedure is done through the groin, you will be instructed to not bend and not cross your legs.  The insertion site will be checked frequently.  The pulse in your foot or wrist will be checked frequently.  You may have additional blood tests, X-rays, and a test that records the electrical activity of your heart (ECG).  Do not drive for 24 hours if you were given a sedative. Summary  A coronary angiogram is an X-ray procedure that is used to look into the arteries in the heart.  During the procedure, a dye (contrast dye) is injected through a long, thin tube (catheter). The catheter is inserted through the groin, wrist, or arm.  Tell your health care provider about any allergies you have, including allergies to contrast dye.  After the procedure, you will need to keep the area still for a few hours, or for as long as  told by your health care provider. This information is not intended to replace advice given to you by your health care provider. Make sure you discuss any questions you have with your health care provider. Document  Released: 02/04/2003 Document Revised: 05/12/2016 Document Reviewed: 05/12/2016 Elsevier Interactive Patient Education  2018 [place], [place], [place]  07/17/2017 2:35 PM    [facility] [address] [place], [place]  [address] Phone: [phone]; Fax: [phone]

## 2017-07-17 NOTE — H&P (View-Only) (Signed)
Cardiology Office Note    Date:  07/17/2017   ID:  [name] [name], DOB 1950-09-27, MRN [id number]  PCP:  Aurea Graff.Marlou Sa, MD  Cardiologist: Dr. Radford Pax  Chief Complaint: Abnormal stress test  History of Present Illness:   [name] [name] is a 66 y.o. male with history of CAD status post CABG s/p CABG x 3 in 2007, hypertension, bilateral carotid artery stenosis and hyperlipidemia presents for abnormal stress test.  He had a regular treadmill stress test in jan 2015 that was negative and a low risk Myoview study in Oct 2016. His LVF was normal.   He was doing well on cardiac standpoint when last seen by Dr. Radford Pax January 2018.  ETT 07/16/17 is abnormal as below and added to my schedule.  Blood pressure demonstrated a hypertensive response to exercise.  Upsloping ST segment depression ST segment depression of 1 mm was noted during stress in the II, V5, V6 and III leads, beginning at 3 minutes of stress.  Good   There are upsloping ST depressions that start at 3 minutes into the exercise.  They get slightly better with recovery, however 2 minutes into recovery he develops more significant 2 mm downsloping ST depressions that eventually resolve 8 minutes into recovery.  The patient has normal functional capacity and was asymptomatic during the test.  This is an abnormal test consistent with ischemia.  Added to my schedule for further discussion.  He is a Administrator.  No regular exercise or walking.  He denies any chest pain, shortness of breath, orthopnea, PND, syncope, lower extremity edema or melena.  Compliant with medication.  No heart healthy diet.  He did not had any anginal symptoms prior to his CABG.  Past Medical History:  Diagnosis Date  . Bilateral carotid artery stenosis    1-39% by dopplers 03/2017  . CAD (coronary artery disease), native coronary artery 08/28/2016   s/p CABG  . Depression    Dr Charissa Bash  . Dyslipidemia   . Hypertension   . Melanoma ([place])  2010   of the scalp Dr Manuela Neptune    Past Surgical History:  Procedure Laterality Date  . CORONARY ARTERY BYPASS GRAFT  2007  . FRACTURE SURGERY     ankle fracture aug 2016  . ORIF ANKLE FRACTURE Left 03/23/2015   Procedure: OPEN REDUCTION INTERNAL FIXATION (ORIF) ANKLE FRACTURE;  Surgeon: Marybelle Killings, MD;  Location: [facility];  Service: Orthopedics;  Laterality: Left;    Current Medications: Prior to Admission medications   Medication Sig Start Date End Date Taking? Authorizing Provider  aspirin 81 MG tablet Take 1 tablet (81 mg total) by mouth daily. 02/08/15   Sueanne Margarita, MD  Calcium Carb-Cholecalciferol (CALCIUM 500 +D PO) Take 1 tablet by mouth daily.    [provider]  ezetimibe (ZETIA) 10 MG tablet Take 1 tablet (10 mg total) by mouth daily. 09/20/16 09/15/17  Sueanne Margarita, MD  famotidine (PEPCID) 20 MG tablet Take 20 mg by mouth 2 (two) times daily.    [provider]  fluticasone (FLONASE) 50 MCG/ACT nasal spray Place 1 spray into both nostrils as needed for allergies artery stenosis rhinitis.    [provider]  loratadine (CLARITIN) 10 MG tablet Take 10 mg by mouth daily.    [provider]  metoprolol succinate (TOPROL-XL) 25 MG 24 hr tablet Take 25 mg by mouth daily. 07/29/16   [provider]  Omega-3 Fatty Acids (FISH OIL) 1000 MG CAPS  Take 1 capsule by mouth 2 (two) times daily.    [provider]  rosuvastatin (CRESTOR) 40 MG tablet TAKE 1 TABLET DAILY. PLEASEKEEP UPCOMING APPOINTMENT  FOR FURTHER REFILLS. 09/28/16   Sueanne Margarita, MD  vitamin C (ASCORBIC ACID) 500 MG tablet Take 500 mg by mouth daily.    [provider]    Allergies:   Penicillins   Social History   Socioeconomic History  . Marital status: Married    Spouse name: None  . Number of children: None  . Years of education: None  . Highest education level: None  Social Needs  . Financial resource strain: None  . Food insecurity -  worry: None  . Food insecurity - inability: None  . Transportation needs - medical: None  . Transportation needs - non-medical: None  Occupational History  . None  Tobacco Use  . Smoking status: Former Smoker    Last attempt to quit: 08/14/1988    Years since quitting: 28.9  . Smokeless tobacco: Never Used  Substance and Sexual Activity  . Alcohol use: Yes    Comment: 5 per week  . Drug use: No  . Sexual activity: None  Other Topics Concern  . None  Social History Narrative  . None     Family History:  The patient's family history includes Heart disease in his father.  ROS:   Please see the history of present illness.    ROS All other systems reviewed and are negative.   PHYSICAL EXAM:   VS:  BP 138/86   Pulse 60   Ht 5\' 8"  (1.727 m)   Wt 217 lb 1.9 oz (98.5 kg)   BMI 33.01 kg/m    GEN: Well nourished, well developed, in no acute distress  HEENT: normal  Neck: no JVD, carotid bruits, or masses Cardiac: RRR; no murmurs, rubs, or gallops, no edema  Respiratory:  clear to auscultation bilaterally, normal work of breathing GI: soft, nontender, nondistended, + BS MS: no deformity or atrophy  Skin: warm and dry, no rash Neuro:  Alert and Oriented x 3, Strength and sensation are intact Psych: euthymic mood, full affect  Wt Readings from Last 3 Encounters:  07/17/17 217 lb 1.9 oz (98.5 kg)  08/28/16 225 lb (102.1 kg)  07/28/16 221 lb 3.2 oz (100.3 kg)      Studies/Labs Reviewed:   EKG:  EKG is ordered today.  The ekg ordered today demonstrates normal sinus rhythm  Recent Labs: 11/20/2016: ALT 22   Lipid Panel    Component Value Date/Time   CHOL 124 11/20/2016 1541   TRIG 160 (H) 11/20/2016 1541   HDL 40 11/20/2016 1541   CHOLHDL 3.1 11/20/2016 1541   CHOLHDL 4 01/12/2015 1219   VLDL 33.4 01/12/2015 1219   LDLCALC 52 11/20/2016 1541    Additional studies/ records that were reviewed today include:  As above   ASSESSMENT & PLAN:    1. Abnormal stress  test with hx of CAD s/p CABG -No chest pain or shortness of breath.  Continue aspirin, beta-blocker and statin. The patient understands that risks include but are not limited to stroke (1 in 1000), death (1 in 66), kidney failure [usually temporary] (1 in 500), bleeding (1 in 200), allergic reaction [possibly serious] (1 in 200), and agrees to proceed.   2. HTN - stable and well controlled on current medications  3. HLD - 11/20/2016: Cholesterol, Total 124; HDL 40; LDL Calculated 52; Triglycerides 160  - Continue statin  4. Bilateral carotid artery stenosis - Recent doppler 03/2017 showed 1-39% bilateral carotid stenosis - continue statin and ASA    Medication Adjustments/Labs and Tests Ordered: Current medicines are reviewed at length with the patient today.  Concerns regarding medicines are outlined above.  Medication changes, Labs and Tests ordered today are listed in the Patient Instructions below. Patient Instructions  Medication Instructions:  Your physician recommends that you continue on your current medications as directed. Please refer to the Current Medication list given to you today.  * If you need a refill on your cardiac medications before your next appointment, please call your pharmacy. *  Labwork: Pre procedure labs today: BMET & CBC w/ diff  Testing/Procedures: Your physician has requested that you have a cardiac catheterization. Cardiac catheterization is used to diagnose and/or treat various heart conditions. Doctors may recommend this procedure for a number of different reasons. The most common reason is to evaluate chest pain. Chest pain can be a symptom of coronary artery disease (CAD), and cardiac catheterization can show whether plaque is narrowing or blocking your heart's arteries. This procedure is also used to evaluate the valves, as well as measure the blood flow and oxygen levels in different parts of your heart. For further information please visit  [email or web address]. Please follow instruction sheet, as given.  Follow-Up: To be determined after heart catheterization  Thank you for choosing [facility]!!    Any Other Special Instructions Will Be Listed Below (If Applicable).    Coronary Angiogram A coronary angiogram is an X-ray procedure that is used to examine the arteries in the heart. In this procedure, a dye (contrast dye) is injected through a long, thin tube (catheter). The catheter is inserted through the groin, wrist, or arm. The dye is injected into each artery, then X-rays are taken to show if there is a blockage in the arteries of the heart. This procedure can also show if you have valve disease or a disease of the aorta, and it can be used to check the overall function of your heart muscle. You may have a coronary angiogram if:  You are having chest pain, or other symptoms of angina, and you are at risk for heart disease.  You have an abnormal electrocardiogram (ECG) or stress test.  You have chest pain and heart failure.  You are having irregular heart rhythms.  You and your health care provider determine that the benefits of the test information outweigh the risks of the procedure.  Let your health care provider know about:  Any allergies you have, including allergies to contrast dye.  All medicines you are taking, including vitamins, herbs, eye drops, creams, and over-the-counter medicines.  Any problems you or family members have had with anesthetic medicines.  Any blood disorders you have.  Any surgeries you have had.  History of kidney problems or kidney failure.  Any medical conditions you have.  Whether you are pregnant or may be pregnant. What are the risks? Generally, this is a safe procedure. However, problems may occur, including:  Infection.  Allergic reaction to medicines or dyes that are used.  Bleeding from the access site or other locations.  Kidney injury, especially in  people with impaired kidney function.  Stroke (rare).  Heart attack (rare).  Damage to other structures or organs.  What happens before the procedure? Staying hydrated Follow instructions from your health care provider about hydration, which may include:  Up to 2 hours before the procedure - you may continue to  drink clear liquids, such as water, clear fruit juice, black coffee, and plain tea.  Eating and drinking restrictions Follow instructions from your health care provider about eating and drinking, which may include:  8 hours before the procedure - stop eating heavy meals or foods such as meat, fried foods, or fatty foods.  6 hours before the procedure - stop eating light meals or foods, such as toast or cereal.  2 hours before the procedure - stop drinking clear liquids.  General instructions  Ask your health care provider about: ? Changing or stopping your regular medicines. This is especially important if you are taking diabetes medicines or blood thinners. ? Taking medicines such as ibuprofen. These medicines can thin your blood. Do not take these medicines before your procedure if your health care provider instructs you not to, though aspirin may be recommended prior to coronary angiograms.  Plan to have someone take you home from the hospital or clinic.  You may need to have blood tests or X-rays done. What happens during the procedure?  An IV tube will be inserted into one of your veins.  You will be given one or more of the following: ? A medicine to help you relax (sedative). ? A medicine to numb the area where the catheter will be inserted into an artery (local anesthetic).  To reduce your risk of infection: ? Your health care team will wash or sanitize their hands. ? Your skin will be washed with soap. ? Hair may be removed from the area where the catheter will be inserted.  You will be connected to a continuous ECG monitor.  The catheter will be  inserted into an artery. The location may be in your groin, in your wrist, or in the fold of your arm (near your elbow).  A type of X-ray (fluoroscopy) will be used to help guide the catheter to the opening of the blood vessel that is being examined.  A dye will be injected into the catheter, and X-rays will be taken. The dye will help to show where any narrowing or blockages are located in the heart arteries.  Tell your health care provider if you have any chest pain or trouble breathing during the procedure.  If blockages are found, your health care provider may perform another procedure, such as inserting a coronary stent. The procedure may vary among health care providers and hospitals. What happens after the procedure?  After the procedure, you will need to keep the area still for a few hours, or for as long as told by your health care provider. If the procedure is done through the groin, you will be instructed to not bend and not cross your legs.  The insertion site will be checked frequently.  The pulse in your foot or wrist will be checked frequently.  You may have additional blood tests, X-rays, and a test that records the electrical activity of your heart (ECG).  Do not drive for 24 hours if you were given a sedative. Summary  A coronary angiogram is an X-ray procedure that is used to look into the arteries in the heart.  During the procedure, a dye (contrast dye) is injected through a long, thin tube (catheter). The catheter is inserted through the groin, wrist, or arm.  Tell your health care provider about any allergies you have, including allergies to contrast dye.  After the procedure, you will need to keep the area still for a few hours, or for as long as  told by your health care provider. This information is not intended to replace advice given to you by your health care provider. Make sure you discuss any questions you have with your health care provider. Document  Released: 02/04/2003 Document Revised: 05/12/2016 Document Reviewed: 05/12/2016 Elsevier Interactive Patient Education  2018 [place], [place], [place]  07/17/2017 2:35 PM    [facility] [place], [place],   [address] Phone: [phone]; Fax: [phone]

## 2017-07-18 ENCOUNTER — Ambulatory Visit (HOSPITAL_COMMUNITY)
Admission: RE | Admit: 2017-07-18 | Discharge: 2017-07-18 | Disposition: A | Discharge status: Home / Self Care | Payer: Medicare Other | Source: Ambulatory Visit | Attending: Cardiovascular Disease | Admitting: Cardiovascular Disease

## 2017-07-18 ENCOUNTER — Encounter (HOSPITAL_COMMUNITY)
Admission: RE | Disposition: A | Discharge status: Home / Self Care | Payer: Self-pay | Source: Ambulatory Visit | Attending: Cardiovascular Disease

## 2017-07-18 ENCOUNTER — Other Ambulatory Visit: Payer: Self-pay

## 2017-07-18 DIAGNOSIS — Z880 Allergy status to penicillin: Secondary | ICD-10-CM | POA: Diagnosis not present

## 2017-07-18 DIAGNOSIS — I6523 Occlusion and stenosis of bilateral carotid arteries: Secondary | ICD-10-CM | POA: Diagnosis not present

## 2017-07-18 DIAGNOSIS — I10 Essential (primary) hypertension: Secondary | ICD-10-CM | POA: Diagnosis not present

## 2017-07-18 DIAGNOSIS — E785 Hyperlipidemia, unspecified: Secondary | ICD-10-CM | POA: Insufficient documentation

## 2017-07-18 DIAGNOSIS — I2582 Chronic total occlusion of coronary artery: Secondary | ICD-10-CM | POA: Insufficient documentation

## 2017-07-18 DIAGNOSIS — Z87891 Personal history of nicotine dependence: Secondary | ICD-10-CM | POA: Insufficient documentation

## 2017-07-18 DIAGNOSIS — I25810 Atherosclerosis of coronary artery bypass graft(s) without angina pectoris: Secondary | ICD-10-CM | POA: Diagnosis not present

## 2017-07-18 DIAGNOSIS — Z7951 Long term (current) use of inhaled steroids: Secondary | ICD-10-CM | POA: Insufficient documentation

## 2017-07-18 DIAGNOSIS — F329 Major depressive disorder, single episode, unspecified: Secondary | ICD-10-CM | POA: Insufficient documentation

## 2017-07-18 DIAGNOSIS — R9439 Abnormal result of other cardiovascular function study: Secondary | ICD-10-CM

## 2017-07-18 DIAGNOSIS — I2510 Atherosclerotic heart disease of native coronary artery without angina pectoris: Secondary | ICD-10-CM

## 2017-07-18 DIAGNOSIS — Z7982 Long term (current) use of aspirin: Secondary | ICD-10-CM | POA: Diagnosis not present

## 2017-07-18 HISTORY — PX: LEFT HEART CATH AND CORS/GRAFTS ANGIOGRAPHY: CATH118250

## 2017-07-18 LAB — PROTIME-INR
INR: 1 (ref 0.8–1.2)
PROTHROMBIN TIME: 10.6 s (ref 9.1–12.0)

## 2017-07-18 LAB — CBC WITH DIFFERENTIAL/PLATELET
Basophils Absolute: 0.1 10*3/uL (ref 0.0–0.2)
Basos: 1 %
EOS (ABSOLUTE): 0.4 10*3/uL (ref 0.0–0.4)
EOS: 5 %
HEMATOCRIT: 43.3 % (ref 37.5–51.0)
HEMOGLOBIN: 15 g/dL (ref 13.0–17.7)
IMMATURE GRANULOCYTES: 0 %
Immature Grans (Abs): 0 10*3/uL (ref 0.0–0.1)
LYMPHS ABS: 2.8 10*3/uL (ref 0.7–3.1)
Lymphs: 35 %
MCH: 32.5 pg (ref 26.6–33.0)
MCHC: 34.6 g/dL (ref 31.5–35.7)
MCV: 94 fL (ref 79–97)
MONOCYTES: 10 %
Monocytes Absolute: 0.8 10*3/uL (ref 0.1–0.9)
Neutrophils Absolute: 3.9 10*3/uL (ref 1.4–7.0)
Neutrophils: 49 %
Platelets: 286 10*3/uL (ref 150–379)
RBC: 4.62 x10E6/uL (ref 4.14–5.80)
RDW: 13.5 % (ref 12.3–15.4)
WBC: 7.9 10*3/uL (ref 3.4–10.8)

## 2017-07-18 LAB — BASIC METABOLIC PANEL
BUN / CREAT RATIO: 10 (ref 10–24)
BUN: 11 mg/dL (ref 8–27)
CO2: 25 mmol/L (ref 20–29)
CREATININE: 1.1 mg/dL (ref 0.76–1.27)
Calcium: 9.9 mg/dL (ref 8.6–10.2)
Chloride: 109 mmol/L — ABNORMAL HIGH (ref 96–106)
GFR calc Af Amer: 80 mL/min/{1.73_m2} (ref >59)
GFR, EST NON AFRICAN AMERICAN: 69 mL/min/{1.73_m2} (ref >59)
GLUCOSE: 99 mg/dL (ref 65–99)
Potassium: 5.5 mmol/L — ABNORMAL HIGH (ref 3.5–5.2)
Sodium: 145 mmol/L — ABNORMAL HIGH (ref 134–144)

## 2017-07-18 SURGERY — LEFT HEART CATH AND CORS/GRAFTS ANGIOGRAPHY
Anesthesia: LOCAL

## 2017-07-18 MED ORDER — ASPIRIN 81 MG PO CHEW
81.0000 mg | CHEWABLE_TABLET | ORAL | Status: DC
Start: 2017-07-19 — End: 2017-07-18

## 2017-07-18 MED ORDER — FENTANYL CITRATE (PF) 100 MCG/2ML IJ SOLN
INTRAMUSCULAR | Status: DC | PRN
  Administered 2017-07-18: 50 ug via INTRAVENOUS

## 2017-07-18 MED ORDER — VERAPAMIL HCL 2.5 MG/ML IV SOLN
INTRAVENOUS | Status: AC
  Filled 2017-07-18: qty 2

## 2017-07-18 MED ORDER — SODIUM CHLORIDE 0.9 % WEIGHT BASED INFUSION: 1.0000 mL/kg/h | INTRAVENOUS | Status: DC

## 2017-07-18 MED ORDER — HEPARIN (PORCINE) IN NACL 2-0.9 UNIT/ML-% IJ SOLN
INTRAMUSCULAR | Status: AC
  Filled 2017-07-18: qty 1000

## 2017-07-18 MED ORDER — MIDAZOLAM HCL 2 MG/2ML IJ SOLN
INTRAMUSCULAR | Status: DC | PRN
  Administered 2017-07-18: 2 mg via INTRAVENOUS

## 2017-07-18 MED ORDER — SODIUM CHLORIDE 0.9% FLUSH: 3.0000 mL | Freq: Two times a day (BID) | INTRAVENOUS | Status: DC

## 2017-07-18 MED ORDER — SODIUM CHLORIDE 0.9 % IV SOLN: 250.0000 mL | INTRAVENOUS | Status: DC | PRN

## 2017-07-18 MED ORDER — FENTANYL CITRATE (PF) 100 MCG/2ML IJ SOLN
INTRAMUSCULAR | Status: AC
  Filled 2017-07-18: qty 2

## 2017-07-18 MED ORDER — VERAPAMIL HCL 2.5 MG/ML IV SOLN
INTRAVENOUS | Status: DC | PRN
  Administered 2017-07-18: 10 mL via INTRA_ARTERIAL

## 2017-07-18 MED ORDER — SODIUM CHLORIDE 0.9 % IV SOLN: INTRAVENOUS | Status: AC

## 2017-07-18 MED ORDER — SODIUM CHLORIDE 0.9% FLUSH: 3.0000 mL | INTRAVENOUS | Status: DC | PRN

## 2017-07-18 MED ORDER — SODIUM CHLORIDE 0.9 % WEIGHT BASED INFUSION
3.0000 mL/kg/h | INTRAVENOUS | Status: AC
  Administered 2017-07-18: 3 mL/kg/h via INTRAVENOUS

## 2017-07-18 MED ORDER — MIDAZOLAM HCL 2 MG/2ML IJ SOLN
INTRAMUSCULAR | Status: AC
  Filled 2017-07-18: qty 2

## 2017-07-18 MED ORDER — HEPARIN (PORCINE) IN NACL 2-0.9 UNIT/ML-% IJ SOLN
INTRAMUSCULAR | Status: AC | PRN
  Administered 2017-07-18: 1000 mL

## 2017-07-18 MED ORDER — LIDOCAINE HCL (PF) 1 % IJ SOLN
INTRAMUSCULAR | Status: AC
  Filled 2017-07-18: qty 30

## 2017-07-18 MED ORDER — LIDOCAINE HCL (PF) 1 % IJ SOLN
INTRAMUSCULAR | Status: DC | PRN
  Administered 2017-07-18: 2 mL via INTRADERMAL

## 2017-07-18 MED ORDER — HEPARIN SODIUM (PORCINE) 1000 UNIT/ML IJ SOLN
INTRAMUSCULAR | Status: DC | PRN
  Administered 2017-07-18: 5000 [IU] via INTRAVENOUS

## 2017-07-18 MED ORDER — IOPAMIDOL (ISOVUE-370) INJECTION 76%
INTRAVENOUS | Status: DC | PRN
  Administered 2017-07-18: 125 mL via INTRA_ARTERIAL

## 2017-07-18 MED ORDER — IOPAMIDOL (ISOVUE-370) INJECTION 76%
INTRAVENOUS | Status: AC
  Filled 2017-07-18: qty 125

## 2017-07-18 MED ORDER — HEPARIN SODIUM (PORCINE) 1000 UNIT/ML IJ SOLN
INTRAMUSCULAR | Status: AC
  Filled 2017-07-18: qty 1

## 2017-07-18 SURGICAL SUPPLY — 12 items
CATH 5FR JL3.5 JR4 ANG PIG MP (CATHETERS) ×2 IMPLANT
CATH INFINITI 5 FR IM (CATHETERS) ×2 IMPLANT
DEVICE RAD COMP TR BAND LRG (VASCULAR PRODUCTS) ×2 IMPLANT
GLIDESHEATH SLEND SS 6F .021 (SHEATH) ×2 IMPLANT
GUIDEWIRE INQWIRE 1.5J.035X260 (WIRE) ×1 IMPLANT
INQWIRE 1.5J .035X260CM (WIRE) ×2
KIT HEART LEFT (KITS) ×2 IMPLANT
PACK CARDIAC CATHETERIZATION (CUSTOM PROCEDURE TRAY) ×2 IMPLANT
SHEATH PINNACLE 6F 10CM (SHEATH) ×2 IMPLANT
SYR MEDRAD MARK V 150ML (SYRINGE) ×2 IMPLANT
TRANSDUCER W/STOPCOCK (MISCELLANEOUS) ×2 IMPLANT
TUBING CIL FLEX 10 FLL-RA (TUBING) ×2 IMPLANT

## 2017-07-18 NOTE — Interval H&P Note (Signed)
History and Physical Interval Note:  07/18/2017 2:59 PM  [name] [name]  has presented today for cardiac cath with the diagnosis of abnormal stress test. The various methods of treatment have been discussed with the patient and family. After consideration of risks, benefits and other options for treatment, the patient has consented to  Procedure(s): LEFT HEART CATH AND CORS/GRAFTS ANGIOGRAPHY (N/A) as a surgical intervention .  The patient's history has been reviewed, patient examined, no change in status, stable for surgery.  I have reviewed the patient's chart and labs.  Questions were answered to the patient's satisfaction.    Cath Lab Visit (complete for each Cath Lab visit)  Clinical Evaluation Leading to the Procedure:   ACS: No.  Non-ACS:    Anginal Classification: No Symptoms  Anti-ischemic medical therapy: Minimal Therapy (1 class of medications)  Non-Invasive Test Results: Intermediate-risk stress test findings: cardiac mortality 1-3%/year  Prior CABG: Prior CABG         [name] [name]

## 2017-07-18 NOTE — Discharge Instructions (Signed)

## 2017-07-19 ENCOUNTER — Encounter (HOSPITAL_COMMUNITY): Payer: Self-pay | Admitting: Cardiovascular Disease

## 2017-07-20 ENCOUNTER — Encounter: Payer: Self-pay | Admitting: *Deleted

## 2017-07-20 ENCOUNTER — Telehealth: Payer: Self-pay | Admitting: Physician Assistant

## 2017-07-20 NOTE — Telephone Encounter (Signed)
Return to work letter done and left at front desk for pt to pick up and pt is aware .[name] [name]

## 2017-07-20 NOTE — Telephone Encounter (Signed)
Follow Up:    Pt wants to know if he is cleared to go back to work on Monday?If so,he will need a note.

## 2017-07-30 ENCOUNTER — Telehealth: Payer: Self-pay | Admitting: Cardiology

## 2017-07-30 NOTE — Telephone Encounter (Signed)
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 States he would like to come get the documentation today around noon. Please call.

## 2017-07-30 NOTE — Telephone Encounter (Signed)
Informed her that paperwork is complete and ready for pick up at the front desk. Patient verbalized understanding and thanked me for the call.

## 2017-08-17 ENCOUNTER — Other Ambulatory Visit: Payer: Self-pay | Admitting: Physician Assistant

## 2017-08-21 ENCOUNTER — Telehealth: Payer: Self-pay

## 2017-08-21 NOTE — Telephone Encounter (Signed)
-----   Message from Sueanne Margarita, MD sent at 08/18/2017  7:43 PM EST ----- Regarding: RE: post cath f/u Ok to followup with PA in the next few weeks and if doing ok then followup with me in 6 months  Traci ----- Message ----- From: Teressa Senter, RN Sent: 08/15/2017   7:57 AM To: Sueanne Margarita, MD Subject: FW: post cath f/u                                ----- Message ----- From: Damian Leavell, RN Sent: 08/13/2017   3:29 PM To: Teressa Senter, RN Subject: post cath f/u                                  Could you find out when Dr. Radford Pax would like to have this patient f/u s/p cath?  There is no recall in for this patient.  He had his cath 07/18/2017.   Thank you! Sonia Baller RN

## 2017-08-21 NOTE — Telephone Encounter (Signed)
Left message to call back to schedule appt

## 2017-08-22 NOTE — Telephone Encounter (Signed)
Called patient to schedule appt for post cath follow up. Patient stated he is not available to come in January. Patient scheduled with Ermalinda Barrios, PA on 09/17/17 which was the first available for the patient. Patient verbalized understanding and thanked me for the call.

## 2017-08-22 NOTE — Telephone Encounter (Signed)
-----   Message from Sueanne Margarita, MD sent at 08/18/2017  7:43 PM EST ----- Regarding: RE: post cath f/u Ok to followup with PA in the next few weeks and if doing ok then followup with me in 6 months  Traci ----- Message ----- From: Teressa Senter, RN Sent: 08/15/2017   7:57 AM To: Sueanne Margarita, MD Subject: FW: post cath f/u                                ----- Message ----- From: Damian Leavell, RN Sent: 08/13/2017   3:29 PM To: Teressa Senter, RN Subject: post cath f/u                                  Could you find out when Dr. Radford Pax would like to have this patient f/u s/p cath?  There is no recall in for this patient.  He had his cath 07/18/2017.   Thank you! Sonia Baller RN

## 2017-09-17 ENCOUNTER — Encounter: Payer: Self-pay | Admitting: Physician Assistant

## 2017-09-17 ENCOUNTER — Ambulatory Visit (INDEPENDENT_AMBULATORY_CARE_PROVIDER_SITE_OTHER): Payer: Medicare Other | Admitting: Physician Assistant

## 2017-09-17 VITALS — BP 126/78 | HR 63 | Ht 68.0 in | Wt 221.0 lb

## 2017-09-17 DIAGNOSIS — E785 Hyperlipidemia, unspecified: Secondary | ICD-10-CM | POA: Diagnosis not present

## 2017-09-17 DIAGNOSIS — I10 Essential (primary) hypertension: Secondary | ICD-10-CM | POA: Diagnosis not present

## 2017-09-17 DIAGNOSIS — I6523 Occlusion and stenosis of bilateral carotid arteries: Secondary | ICD-10-CM

## 2017-09-17 DIAGNOSIS — I2510 Atherosclerotic heart disease of native coronary artery without angina pectoris: Secondary | ICD-10-CM

## 2017-09-17 NOTE — Patient Instructions (Addendum)
Your physician recommends that you continue on your current medications as directed. Please refer to the Current Medication list given to you today.   Your physician recommends that you schedule a follow-up appointment in:  DR TURNER IN YEAR

## 2017-09-17 NOTE — Progress Notes (Signed)
Cardiology Office Note    Date:  09/17/2017   ID:  [name] [name], DOB [birth year], MRN [id number]  PCP:  Aurea Graff.Marlou Sa, MD  Cardiologist: Fransico Him, MD  Chief Complaint  Patient presents with  . Follow-up    History of Present Illness:  [name] [name] is a 67 y.o. male with history of CAD status post CABG x3 in 2007, hypertension, bilateral carotid artery stenosis 1-39%, and HLD.  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  This was abnormal and led to cardiac catheterization 07/18/17 which revealed severe double vessel CAD with 2 out of 3 patent bypass grafts.  Circumflex system had mild disease in the graft that is now closed went to the circumflex system.  LAD had severe mid stenosis and bidirectional flow from antegrade and retrograde from patent LIMA.  RCA diffuse severe mid stenosis but patent bypass graft to the distal RCA.  Normal LV function.  Medical management recommended.  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  Patient comes in today for post-cath follow-up.  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  He does not get much exercise because of this.  Cholesterol is managed by Dr. Alroy Dust.  He denies any chest pain, palpitations, dyspnea, dyspnea on exertion, dizziness or presyncope.    Past Medical History:  Diagnosis Date  . Bilateral carotid artery stenosis    1-39% by dopplers 03/2017  . CAD (coronary artery disease), native coronary artery 08/28/2016   s/p CABG  . Depression    Dr Charissa Bash  . Dyslipidemia   . Hypertension   . Melanoma ([place]) 2010   of the scalp Dr Manuela Neptune    Past Surgical History:  Procedure Laterality Date  . CORONARY ARTERY BYPASS GRAFT  2007  . FRACTURE SURGERY     ankle fracture aug 2016  . LEFT HEART CATH AND CORS/GRAFTS ANGIOGRAPHY N/A 07/18/2017   Procedure: LEFT HEART CATH AND CORS/GRAFTS ANGIOGRAPHY;  Surgeon: Burnell Blanks, MD;  Location: [facility];  Service:  Cardiovascular;  Laterality: N/A;  . ORIF ANKLE FRACTURE Left 03/23/2015   Procedure: OPEN REDUCTION INTERNAL FIXATION (ORIF) ANKLE FRACTURE;  Surgeon: Marybelle Killings, MD;  Location: [facility];  Service: Orthopedics;  Laterality: Left;    Current Medications: Current Meds  Medication Sig  . aspirin 81 MG tablet Take 1 tablet (81 mg total) by mouth daily.  . Calcium Carb-Cholecalciferol (CALCIUM 500 +D PO) Take 1 tablet by mouth daily.  . famotidine (PEPCID) 20 MG tablet Take 20 mg by mouth 2 (two) times daily.  . fluticasone (FLONASE) 50 MCG/ACT nasal spray Place 1 spray into both nostrils as needed for allergies or rhinitis.  Marland Kitchen loratadine (CLARITIN) 10 MG tablet Take 10 mg by mouth daily.  . metoprolol succinate (TOPROL-XL) 25 MG 24 hr tablet TAKE 1 TABLET (25 MG TOTAL) BY MOUTH DAILY.  Marland Kitchen Omega-3 Fatty Acids (FISH OIL) 1000 MG CAPS Take 1 capsule by mouth 2 (two) times daily.  . rosuvastatin (CRESTOR) 40 MG tablet TAKE 1 TABLET DAILY. PLEASEKEEP UPCOMING APPOINTMENT  FOR FURTHER REFILLS.  Marland Kitchen vitamin C (ASCORBIC ACID) 500 MG tablet Take 1,000 mg by mouth daily.      Allergies:   Penicillins   Social History   Socioeconomic History  . Marital status: Married    Spouse name: None  . Number of children: None  . Years of education: None  .  Highest education level: None  Social Needs  . Financial resource strain: None  . Food insecurity - worry: None  . Food insecurity - inability: None  . Transportation needs - medical: None  . Transportation needs - non-medical: None  Occupational History  . None  Tobacco Use  . Smoking status: Former Smoker    Last attempt to quit: 08/14/1988    Years since quitting: 29.1  . Smokeless tobacco: Never Used  Substance and Sexual Activity  . Alcohol use: Yes    Comment: 5 per week  . Drug use: No  . Sexual activity: None  Other Topics Concern  . None  Social History Narrative  . None     Family History:  The patient's family history includes  Heart disease in his father.   ROS:   Please see the history of present illness.    Review of Systems  Constitution: Negative.  HENT: Negative.   Cardiovascular: Negative.   Respiratory: Negative.   Endocrine: Negative.   Hematologic/Lymphatic: Negative.   Musculoskeletal: Negative.        Sciatic nerve pain  Gastrointestinal: Negative.   Genitourinary: Negative.   Neurological: Negative.    All other systems reviewed and are negative.   PHYSICAL EXAM:   VS:  BP 126/78   Pulse 63   Ht 5\' 8"  (1.727 m)   Wt 221 lb (100.2 kg)   SpO2 94%   BMI 33.60 kg/m   Physical Exam  GEN: Obese, in no acute distress  Neck: no JVD, carotid bruits, or masses Cardiac:RRR; no murmurs, rubs, or gallops  Respiratory:  clear to auscultation bilaterally, normal work of breathing GI: soft, nontender, nondistended, + BS Ext: without cyanosis, clubbing, or edema, Good distal pulses bilaterally Neuro:  Alert and Oriented x 3 Psych: euthymic mood, full affect  Wt Readings from Last 3 Encounters:  09/17/17 221 lb (100.2 kg)  07/18/17 217 lb (98.4 kg)  07/17/17 217 lb 1.9 oz (98.5 kg)      Studies/Labs Reviewed:   EKG:  EKG is not ordered today.   Recent Labs: 11/20/2016: ALT 22 07/17/2017: BUN 11; Creatinine, Ser 1.10; Hemoglobin 15.0; Platelets 286; Potassium 5.5; Sodium 145   Lipid Panel    Component Value Date/Time   CHOL 124 11/20/2016 1541   TRIG 160 (H) 11/20/2016 1541   HDL 40 11/20/2016 1541   CHOLHDL 3.1 11/20/2016 1541   CHOLHDL 4 01/12/2015 1219   VLDL 33.4 01/12/2015 1219   LDLCALC 52 11/20/2016 1541    Additional studies/ records that were reviewed today include:  Cardiac cath 12/5/18Conclusion      Ost RCA to Prox RCA lesion is 40% stenosed.  Prox RCA to Mid RCA lesion is 95% stenosed.  SVG graft was visualized by angiography and is normal in caliber.  The graft exhibits no disease.  Ost Cx to Prox Cx lesion is 30% stenosed.  Origin to Prox Graft lesion is  100% stenosed.  Ost LAD to Prox LAD lesion is 70% stenosed.  Prox LAD lesion is 90% stenosed.  LIMA graft was visualized by angiography and is normal in caliber.  The graft exhibits no disease.  SVG graft was visualized by angiography.  The left ventricular systolic function is normal.  LV end diastolic pressure is normal.  The left ventricular ejection fraction is 55-65% by visual estimate.  There is no mitral valve regurgitation.   1. Severe double vessel CAD s/p 3V CABG with 2/3 patent bypass grafts. The Circumflex system has  mild disease and the graft that is now closed went to the Circumflex system. The LAD has severe mid stenosis and bidirectional flow from antegrade and retrograde from the patent LIMA graft. The RCA has diffuse severe mid stenosis. The bypass graft to the distal RCA is patent.  2. Normal LV systolic function   Recommendations: Continue medical management of CAD. 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      Carotid Dopplers 8/20/18Notes recorded by Sueanne Margarita, MD on 04/03/2017 at 8:22 AM EDT 1-39% bilateral carotid stenosis - continue statin and ASA   GXT 12/3/18Blood pressure demonstrated a hypertensive response to exercise.  Upsloping ST segment depression ST segment depression of 1 mm was noted during stress in the II, V5, V6 and III leads, beginning at 3 minutes of stress.  Good   There are upsloping ST depressions that start at 3 minutes into the exercise.  They get slightly better with recovery, however 2 minutes into recovery he develops more significant 2 mm downsloping ST depressions that eventually resolve 8 minutes into recovery.   The patient has normal functional capacity and was asymptomatic during the test.   This is an abnormal test consistent with ischemia.      ASSESSMENT:    1. Coronary artery disease involving native coronary artery of native heart without angina pectoris   2. Essential hypertension, benign   3. Dyslipidemia    4. Bilateral carotid artery stenosis      PLAN:  In order of problems listed above:  CAD status post CABG x3 in 2007 with abnormal GXT 07/2017 leading to cardiac catheterization showing 2/3 bypass grafts patent circumflex system had mild disease in the graft to that area was occluded patent LIMA to the LAD and patent SVG to the RCA, normal LV function.  Cleared to have a CDL renewed.  Doing well without angina.  Follow-up with Dr. Radford Pax in 1 year.  Hypertension blood pressure well controlled.  Dyslipidemia last lipid panel we have on file was from 11/2016 and LDL was at goal, triglycerides were up a little.  He thinks he has had one since by Dr. Alroy Dust.  Continue Crestor, Zetia and fish oil  Bilateral carotid artery stenosis with Dopplers 07/2017 1-39%    Medication Adjustments/Labs and Tests Ordered: Current medicines are reviewed at length with the patient today.  Concerns regarding medicines are outlined above.  Medication changes, Labs and Tests ordered today are listed in the Patient Instructions below. Patient Instructions  Your physician recommends that you continue on your current medications as directed. Please refer to the Current Medication list given to you today.   Your physician recommends that you schedule a follow-up appointment in:  DR TURNER IN YEAR     Signed, Ermalinda Barrios, PA-C  09/17/2017 1:45 PM    [facility] [place], [place],   [address] Phone: [phone]; Fax: [phone]

## 2017-09-23 ENCOUNTER — Other Ambulatory Visit: Payer: Self-pay | Admitting: Cardiology

## 2017-10-18 ENCOUNTER — Other Ambulatory Visit: Payer: Self-pay | Admitting: Cardiology

## 2018-03-25 DIAGNOSIS — M79605 Pain in left leg: Secondary | ICD-10-CM | POA: Diagnosis not present

## 2018-07-19 DIAGNOSIS — H524 Presbyopia: Secondary | ICD-10-CM | POA: Diagnosis not present

## 2018-07-19 DIAGNOSIS — H2513 Age-related nuclear cataract, bilateral: Secondary | ICD-10-CM | POA: Diagnosis not present

## 2018-07-19 DIAGNOSIS — H43813 Vitreous degeneration, bilateral: Secondary | ICD-10-CM | POA: Diagnosis not present

## 2018-08-16 ENCOUNTER — Other Ambulatory Visit: Payer: Self-pay | Admitting: Physician Assistant

## 2018-08-28 ENCOUNTER — Other Ambulatory Visit: Payer: Self-pay | Admitting: Cardiology

## 2018-09-10 ENCOUNTER — Other Ambulatory Visit: Payer: Self-pay | Admitting: Physician Assistant

## 2018-10-04 ENCOUNTER — Other Ambulatory Visit: Payer: Self-pay | Admitting: Physician Assistant

## 2018-11-05 ENCOUNTER — Other Ambulatory Visit: Payer: Self-pay | Admitting: Physician Assistant

## 2018-11-26 ENCOUNTER — Other Ambulatory Visit: Payer: Self-pay | Admitting: Cardiology

## 2018-12-13 ENCOUNTER — Telehealth: Payer: Self-pay | Admitting: Cardiology

## 2018-12-13 NOTE — Telephone Encounter (Signed)
Video/[email or web address].me/[phone]//verbal consent/virtals  YOUR CARDIOLOGY TEAM HAS ARRANGED FOR AN E-VISIT FOR YOUR APPOINTMENT - PLEASE REVIEW IMPORTANT INFORMATION BELOW SEVERAL DAYS PRIOR TO YOUR APPOINTMENT  Due to the recent COVID-19 pandemic, we are transitioning in-person office visits to tele-medicine visits in an effort to decrease unnecessary exposure to our patients, their families, and staff. These visits are billed to your insurance just like a normal visit is. We also encourage you to sign up for MyChart if you have not already done so. You will need a smartphone if possible. For patients that do not have this, we can still complete the visit using a regular telephone but do prefer a smartphone to enable video when possible. You may have a family member that lives with you that can help. If possible, we also ask that you have a blood pressure cuff and scale at home to measure your blood pressure, heart rate and weight prior to your scheduled appointment. Patients with clinical needs that need an in-person evaluation and testing will still be able to come to the office if absolutely necessary. If you have any questions, feel free to call our office.     YOUR PROVIDER WILL BE USING THE FOLLOWING PLATFORM TO COMPLETE YOUR VISIT:  [email or web address]   IF USING MYCHART - How to Download the MyChart App to Your SmartPhone   - If Apple, go to [employer] and type in MyChart in the search bar and download the app. If Android, ask patient to go to [employer] and type in [place] in the search bar and download the app. The app is free but as with any other app downloads, your phone may require you to verify saved payment information or Apple/Android password.  - You will need to then log into the app with your MyChart username and password, and select [facility] as your healthcare provider to link the account.  - When it is time for your visit, go to the MyChart app, find appointments, and click  Begin Video Visit. Be sure to Select Allow for your device to access the Microphone and Camera for your visit. You will then be connected, and your provider will be with you shortly.  **If you have any issues connecting or need assistance, please contact MyChart service desk [phone])**  **If using a computer, in order to ensure the best quality for your visit, you will need to use either of the following Internet Browsers: Insurance underwriter or [employer] Edge**   IF USING DOXIMITY or [email or web address] - The staff will give you instructions on receiving your link to join the meeting the day of your visit.      2-3 DAYS BEFORE YOUR APPOINTMENT  You will receive a telephone call from one of our [facility] team members - your caller ID may say "Unknown caller." If this is a video visit, we will walk you through how to get the video launched on your phone. We will remind you check your blood pressure, heart rate and weight prior to your scheduled appointment. If you have an Apple Watch or Kardia, please upload any pertinent ECG strips the day before or morning of your appointment to [facility]. Our staff will also make sure you have reviewed the consent and agree to move forward with your scheduled tele-health visit.     THE DAY OF YOUR APPOINTMENT  Approximately 15 minutes prior to your scheduled appointment, you will receive a telephone call from one of [facility] team - your caller  ID may say "Unknown caller."  Our staff will confirm medications, vital signs for the day and any symptoms you may be experiencing. Please have this information available prior to the time of visit start. It may also be helpful for you to have a pad of paper and pen handy for any instructions given during your visit. They will also walk you through joining the smartphone meeting if this is a video visit.    CONSENT FOR TELE-HEALTH VISIT - PLEASE REVIEW  I hereby voluntarily request, consent and authorize [facility] and its employed or contracted physicians, physician assistants, nurse practitioners or other licensed health care professionals (the Practitioner), to provide me with telemedicine health care services (the Services") as deemed necessary by the treating Practitioner. I acknowledge and consent to receive the Services by the Practitioner via telemedicine. I understand that the telemedicine visit will involve communicating with the Practitioner through live audiovisual communication technology and the disclosure of certain medical information by electronic transmission. I acknowledge that I have been given the opportunity to request an in-person assessment or other available alternative prior to the telemedicine visit and am voluntarily participating in the telemedicine visit.  I understand that I have the right to withhold or withdraw my consent to the use of telemedicine in the course of my care at any time, without affecting my right to future care or treatment, and that the Practitioner or I may terminate the telemedicine visit at any time. I understand that I have the right to inspect all information obtained and/or recorded in the course of the telemedicine visit and may receive copies of available information for a reasonable fee.  I understand that some of the potential risks of receiving the Services via telemedicine include:   Delay or interruption in medical evaluation due to technological equipment failure or disruption;  Information transmitted may not be sufficient (e.g. poor resolution of images) to allow for appropriate medical decision making by the Practitioner; and/or   In rare instances, security protocols could fail, causing a breach of personal health information.  Furthermore, I acknowledge that it is my responsibility to provide information about my medical history, conditions and care that is complete and accurate to the best of my ability. I acknowledge that Practitioner's  advice, recommendations, and/or decision may be based on factors not within their control, such as incomplete or inaccurate data provided by me or distortions of diagnostic images or specimens that may result from electronic transmissions. I understand that the practice of medicine is not an exact science and that Practitioner makes no warranties or guarantees regarding treatment outcomes. I acknowledge that I will receive a copy of this consent concurrently upon execution via email to the email address I last provided but may also request a printed copy by calling the office of [facility].    I understand that my insurance will be billed for this visit.   I have read or had this consent read to me.  I understand the contents of this consent, which adequately explains the benefits and risks of the Services being provided via telemedicine.   I have been provided ample opportunity to ask questions regarding this consent and the Services and have had my questions answered to my satisfaction.  I give my informed consent for the services to be provided through the use of telemedicine in my medical care  By participating in this telemedicine visit I agree to the above.

## 2018-12-15 ENCOUNTER — Other Ambulatory Visit: Payer: Self-pay | Admitting: Cardiology

## 2018-12-17 NOTE — Progress Notes (Signed)
Virtual Visit via Video Note   This visit type was conducted due to national recommendations for restrictions regarding the COVID-19 Pandemic (e.g. social distancing) in an effort to limit this patient's exposure and mitigate transmission in our community.  Due to his co-morbid illnesses, this patient is at least at moderate risk for complications without adequate follow up.  This format is felt to be most appropriate for this patient at this time.  All issues noted in this document were discussed and addressed.  A limited physical exam was performed with this format.  Please refer to the patient's chart for his consent to telehealth for [facility].   Evaluation Performed:  Follow-up visit  This visit type was conducted due to national recommendations for restrictions regarding the COVID-19 Pandemic (e.g. social distancing).  This format is felt to be most appropriate for this patient at this time.  All issues noted in this document were discussed and addressed.  No physical exam was performed (except for noted visual exam findings with Video Visits).  Please refer to the patient's chart (MyChart message for video visits and phone note for telephone visits) for the patient's consent to telehealth for [facility].  Date:  12/18/2018   ID:  [name] [name], DOB 1951-04-13, MRN [id number]  Patient Location:  Home  Provider location:   [facility]  PCP:  Alroy Dust, Carlean Jews.Marlou Sa, MD  Cardiologist:  Fransico Him, MD  Electrophysiologist:  None   Chief Complaint:  CAD, HTN and lipids  History of Present Illness:    [name] [name] is a 68 y.o. male who presents via audio/video conferencing for a telehealth visit today.    [name] [name] is a 68 y.o. male with a history of ASCAD s/p CABG x 3 in 2007, HTN, bilateral carotid artery stenosis (1-39%) and dyslipidemia. 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  Circumflex system had mild disease in the graft that is now closed,  LAD had severe mid stenosis and bidirectional flow from antegrade and retrograde from patent LIMA.  RCA had diffuse severe mid stenosis but patent bypass graft to the distal RCA.  Normal LV function.  Medical management recommended.  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  He is here today for followup and is doing well.  He denies any chest pain or pressure, SOB, DOE, PND, orthopnea, LE edema, dizziness, palpitations or syncope. He is compliant with his meds and is tolerating meds with no SE.    The patient does not have symptoms concerning for COVID-19 infection (fever, chills, cough, or new shortness of breath).    Prior CV studies:   The following studies were reviewed today:  Cardiac cath 07/2017  Past Medical History:  Diagnosis Date  . Bilateral carotid artery stenosis    1-39% by dopplers 03/2017  . CAD (coronary artery disease), native coronary artery 08/28/2016   s/p CABG  . Depression    Dr Charissa Bash  . Dyslipidemia   . Hypertension   . Melanoma ([place]) 2010   of the scalp Dr Manuela Neptune   Past Surgical History:  Procedure Laterality Date  . CORONARY ARTERY BYPASS GRAFT  2007  . FRACTURE SURGERY     ankle fracture aug 2016  . LEFT HEART CATH AND CORS/GRAFTS ANGIOGRAPHY N/A 07/18/2017   Procedure: LEFT HEART CATH AND CORS/GRAFTS ANGIOGRAPHY;  Surgeon: [name]  D, MD;  Location: [facility];  Service: Cardiovascular;  Laterality: N/A;  . ORIF ANKLE FRACTURE Left 03/23/2015   Procedure: OPEN REDUCTION INTERNAL FIXATION (ORIF) ANKLE FRACTURE;  Surgeon: Marybelle Killings, MD;  Location: [facility];  Service: Orthopedics;  Laterality: Left;     Current Meds  Medication Sig  . aspirin 81 MG tablet Take 1 tablet (81 mg total) by mouth daily.  . Calcium Carb-Cholecalciferol (CALCIUM 500 +D PO) Take 1 tablet by mouth daily.  Marland Kitchen ezetimibe (ZETIA) 10 MG tablet TAKE 1  TABLET BY MOUTH EVERY DAY. MAKE APPOINTMENT FOR FURTHER REFILLS  . famotidine (PEPCID) 20 MG tablet Take 20 mg by mouth 2 (two) times daily.  . fluticasone (FLONASE) 50 MCG/ACT nasal spray Place 1 spray into both nostrils as needed for allergies or rhinitis.  Marland Kitchen loratadine (CLARITIN) 10 MG tablet Take 10 mg by mouth daily.  . metoprolol succinate (TOPROL-XL) 25 MG 24 hr tablet Take 1 tablet (25 mg total) by mouth daily. Please schedule appt for future refills. 1st attempt.  . Omega-3 Fatty Acids (FISH OIL) 1000 MG CAPS Take 1 capsule by mouth 2 (two) times daily.  . rosuvastatin (CRESTOR) 40 MG tablet TAKE 1 TABLET DAILY.  . vitamin C (ASCORBIC ACID) 500 MG tablet Take 1,000 mg by mouth daily.      Allergies:   Penicillins   Social History   Tobacco Use  . Smoking status: Former Smoker    Last attempt to quit: 08/14/1988    Years since quitting: 30.3  . Smokeless tobacco: Never Used  Substance Use Topics  . Alcohol use: Yes    Comment: 5 per week  . Drug use: No     Family Hx: The patient's family history includes Heart disease in his father.  ROS:   Please see the history of present illness.     All other systems reviewed and are negative.   Labs/Other Tests and Data Reviewed:    Recent Labs: No results found for requested labs within last 8760 hours.   Recent Lipid Panel Lab Results  Component Value Date/Time   CHOL 124 11/20/2016 03:41 PM   TRIG 160 (H) 11/20/2016 03:41 PM   HDL 40 11/20/2016 03:41 PM   CHOLHDL 3.1 11/20/2016 03:41 PM   CHOLHDL 4 01/12/2015 12:19 PM   LDLCALC 52 11/20/2016 03:41 PM    Wt Readings from Last 3 Encounters:  12/18/18 187 lb 6.4 oz (85 kg)  09/17/17 221 lb (100.2 kg)  07/18/17 217 lb (98.4 kg)     Objective:    Vital Signs:  BP (!) 147/84   Pulse 70   Ht 5\' 8"  (1.727 m)   Wt 187 lb 6.4 oz (85 kg)   BMI 28.49 kg/m    CONSTITUTIONAL:  Well nourished, well developed male in no acute distress.  EYES: anicteric MOUTH: oral  mucosa is pink RESPIRATORY: Normal respiratory effort, symmetric expansion CARDIOVASCULAR: No peripheral edema SKIN: No rash, lesions or ulcers MUSCULOSKELETAL: no digital cyanosis NEURO: Cranial Nerves II-XII grossly intact, moves all extremities PSYCH: Intact judgement and insight.  A&O x 3, Mood/affect appropriate   ASSESSMENT & PLAN:    1.  ASCAD - he is s/p remote CABG in 2007.  Repeat cath 07/2017 showed evere 2 vessel CAD with 2/3 patent bypass grafts.  The SVG to OM graft was occluded,  LAD had severe mid stenosis and bidirectional flow from antegrade and retrograde from patent LIMA and RCA had diffuse severe mid stenosis  but patent bypass graft to the distal RCA.  Normal LV function.  Medical management recommended. He has not had any anginal sx.  He will continue on ASA 81g daily, BB and statin. 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  I will get a stress myoview to assess for ischemia.   2.  Hypertension - his BP is borderline controlled.  He will continue on Toprol XL 25mg  daily    3.  Hyperlipidemia - his LDL goal is < 70.  He will continue on Crestor 40mg  daily and Zetia 10mg  daily.  I will check an FLP and ALT in July.  He has lost 40lbs in the past 6 months on the [employer] app.    4.  Bilateral carotid artery stenosis - carotid dopplers on 03/2017 showed 1-39% stenosis.  I will repeat dopplers in July to make sure this is stable.  He will continue on ASA and statin.   5.  COVID-19 Education:The signs and symptoms of COVID-19 were discussed with the patient and how to seek care for testing (follow up with PCP or arrange E-visit).  The importance of social distancing was discussed today.  Patient Risk:   After full review of this patient's clinical status, I feel that they are at least moderate risk at this time.  Time:   Today, I have spent 20 minutes directly with the patient on video discussing medical problems including CAD, HTN, hyperlipidemia and  carotid stenosis.  We also reviewed the symptoms of COVID 19 and the ways to protect against contracting the virus with telehealth technology.  I spent an additional 5 minutes reviewing patient's chart including carotid dopplers and labs.  Medication Adjustments/Labs and Tests Ordered: Current medicines are reviewed at length with the patient today.  Concerns regarding medicines are outlined above.  Tests Ordered: No orders of the defined types were placed in this encounter.  Medication Changes: No orders of the defined types were placed in this encounter.   Disposition:  Follow up in 1 year(s)  Signed, Fransico Him, MD  12/18/2018 12:34 PM    [place]

## 2018-12-17 NOTE — Telephone Encounter (Signed)
Virtual Visit Pre-Appointment Phone Call  "(Name), I am calling you today to discuss your upcoming appointment. We are currently trying to limit exposure to the virus that causes COVID-19 by seeing patients at home rather than in the office."  1. "What is the BEST phone number to call the day of the visit?" - include this in appointment notes  2. Do you have or have access to (through a family member/friend) a smartphone with video capability that we can use for your visit?" a. If yes - list this number in appt notes as cell (if different from BEST phone #) and list the appointment type as a VIDEO visit in appointment notes b. If no - list the appointment type as a PHONE visit in appointment notes  Confirm consent - "In the setting of the current Covid19 crisis, you are scheduled for a (phone or video) visit with your provider on (date) at (time).  Just as we do with many in-office visits, in order for you to participate in this visit, we must obtain consent.  If you'd like, I can send this to your mychart (if signed up) or email for you to review.  Otherwise, I can obtain your verbal consent now.  All virtual visits are billed to your insurance company just like a normal visit would be.  By agreeing to a virtual visit, we'd like you to understand that the technology does not allow for your provider to perform an examination, and thus may limit your provider's ability to fully assess your condition. If your provider identifies any concerns that need to be evaluated in person, we will make arrangements to do so.  Finally, though the technology is pretty good, we cannot assure that it will always work on either your or our end, and in the setting of a video visit, we may have to convert it to a phone-only visit.  In either situation, we cannot ensure that we have a secure connection.  Are you willing to proceed?"  Yes 12/13/18 consent    3. Advise patient to be prepared - "Two hours prior to your  appointment, go ahead and check your blood pressure, pulse, oxygen saturation, and your weight (if you have the equipment to check those) and write them all down. When your visit starts, your provider will ask you for this information. If you have an Apple Watch or Kardia device, please plan to have heart rate information ready on the day of your appointment. Please have a pen and paper handy nearby the day of the visit as well."  4. Give patient instructions for MyChart download to smartphone OR [email or web address] as below if video visit (depending on what platform provider is using)  5. Inform patient they will receive a phone call 15 minutes prior to their appointment time (may be from unknown caller ID) so they should be prepared to answer    TELEPHONE CALL NOTE  [name] [name] has been deemed a candidate for a follow-up tele-health visit to limit community exposure during the Covid-19 pandemic. I spoke with the patient via phone to ensure availability of phone/video source, confirm preferred email & phone number, and discuss instructions and expectations.  I reminded [name] [name] to be prepared with any vital sign and/or heart rhythm information that could potentially be obtained via home monitoring, at the time of his visit. I reminded [name] [name] to expect a phone call prior to his visit.  Armando Gang 12/17/2018 9:17 AM  INSTRUCTIONS FOR DOWNLOADING THE MYCHART APP TO SMARTPHONE  - The patient must first make sure to have activated MyChart and know their login information - If Apple, go to [employer] and type in MyChart in the search bar and download the app. If Android, ask patient to go to [employer] and type in [place] in the search bar and download the app. The app is free but as with any other app downloads, their phone may require them to verify saved payment information or Apple/Android password.  - The patient will need to then log into the app with their MyChart  username and password, and select  as their healthcare provider to link the account. When it is time for your visit, go to the MyChart app, find appointments, and click Begin Video Visit. Be sure to Select Allow for your device to access the Microphone and Camera for your visit. You will then be connected, and your provider will be with you shortly.  **If they have any issues connecting, or need assistance please contact MyChart service desk [phone])**  **If using a computer, in order to ensure the best quality for their visit they will need to use either of the following Internet Browsers: [employer], or Google Chrome**  IF USING DOXIMITY or DOXY.ME - The patient will receive a link just prior to their visit by text.     FULL LENGTH CONSENT FOR TELE-HEALTH VISIT   I hereby voluntarily request, consent and authorize [facility] and its employed or contracted physicians, physician assistants, nurse practitioners or other licensed health care professionals (the Practitioner), to provide me with telemedicine health care services (the Services") as deemed necessary by the treating Practitioner. I acknowledge and consent to receive the Services by the Practitioner via telemedicine. I understand that the telemedicine visit will involve communicating with the Practitioner through live audiovisual communication technology and the disclosure of certain medical information by electronic transmission. I acknowledge that I have been given the opportunity to request an in-person assessment or other available alternative prior to the telemedicine visit and am voluntarily participating in the telemedicine visit.  I understand that I have the right to withhold or withdraw my consent to the use of telemedicine in the course of my care at any time, without affecting my right to future care or treatment, and that the Practitioner or I may terminate the telemedicine visit at any  time. I understand that I have the right to inspect all information obtained and/or recorded in the course of the telemedicine visit and may receive copies of available information for a reasonable fee.  I understand that some of the potential risks of receiving the Services via telemedicine include:   Delay or interruption in medical evaluation due to technological equipment failure or disruption;  Information transmitted may not be sufficient (e.g. poor resolution of images) to allow for appropriate medical decision making by the Practitioner; and/or   In rare instances, security protocols could fail, causing a breach of personal health information.  Furthermore, I acknowledge that it is my responsibility to provide information about my medical history, conditions and care that is complete and accurate to the best of my ability. I acknowledge that Practitioner's advice, recommendations, and/or decision may be based on factors not within their control, such as incomplete or inaccurate data provided by me or distortions of diagnostic images or specimens that may result from electronic transmissions. I understand that the practice of medicine is not an exact science  and that Practitioner makes no warranties or guarantees regarding treatment outcomes. I acknowledge that I will receive a copy of this consent concurrently upon execution via email to the email address I last provided but may also request a printed copy by calling the office of [facility].    I understand that my insurance will be billed for this visit.   I have read or had this consent read to me.  I understand the contents of this consent, which adequately explains the benefits and risks of the Services being provided via telemedicine.   I have been provided ample opportunity to ask questions regarding this consent and the Services and have had my questions answered to my satisfaction.  I give my informed consent for the services  to be provided through the use of telemedicine in my medical care  By participating in this telemedicine visit I agree to the above.

## 2018-12-18 ENCOUNTER — Telehealth (INDEPENDENT_AMBULATORY_CARE_PROVIDER_SITE_OTHER): Payer: Medicare Other | Admitting: Cardiology

## 2018-12-18 ENCOUNTER — Encounter: Payer: Self-pay | Admitting: Cardiology

## 2018-12-18 ENCOUNTER — Other Ambulatory Visit: Payer: Self-pay

## 2018-12-18 VITALS — BP 147/84 | HR 70 | Ht 68.0 in | Wt 187.4 lb

## 2018-12-18 DIAGNOSIS — I2510 Atherosclerotic heart disease of native coronary artery without angina pectoris: Secondary | ICD-10-CM

## 2018-12-18 DIAGNOSIS — Z7189 Other specified counseling: Secondary | ICD-10-CM

## 2018-12-18 DIAGNOSIS — I6523 Occlusion and stenosis of bilateral carotid arteries: Secondary | ICD-10-CM | POA: Diagnosis not present

## 2018-12-18 DIAGNOSIS — E785 Hyperlipidemia, unspecified: Secondary | ICD-10-CM

## 2018-12-18 DIAGNOSIS — I10 Essential (primary) hypertension: Secondary | ICD-10-CM

## 2018-12-18 NOTE — Patient Instructions (Signed)
Medication Instructions:  Your physician recommends that you continue on your current medications as directed. Please refer to the Current Medication list given to you today.  If you need a refill on your cardiac medications before your next appointment, please call your pharmacy.   Lab work: Fasting Labs: Lipid and Liver, same day as stress test in July.  If you have labs (blood work) drawn today and your tests are completely normal, you will receive your results only by: Marland Kitchen MyChart Message (if you have MyChart) OR . A paper copy in the mail If you have any lab test that is abnormal or we need to change your treatment, we will call you to review the results.  Testing/Procedures: Your physician has requested that you have a carotid duplex in late July. This test is an ultrasound of the carotid arteries in your neck. It looks at blood flow through these arteries that supply the brain with blood. Allow one hour for this exam. There are no restrictions or special instructions.  Your physician has requested that you have en exercise stress myoview in July. For further information please visit [email or web address]. Please follow instruction sheet, as given.  Follow-Up: At [facility], you and your health needs are our priority.  As part of our continuing mission to provide you with exceptional heart care, we have created designated Provider Care Teams.  These Care Teams include your primary Cardiologist (physician) and Advanced Practice Providers (APPs -  Physician Assistants and Nurse Practitioners) who all work together to provide you with the care you need, when you need it. You will need a follow up appointment in 1 years.  Please call our office 2 months in advance to schedule this appointment.  You may see Fransico Him, MD or one of the following Advanced Practice Providers on your designated Care Team:   [place], PA-C Melina Copa, PA-C . Ermalinda Barrios, PA-C

## 2019-01-02 ENCOUNTER — Encounter: Payer: Self-pay | Admitting: Cardiology

## 2019-01-31 ENCOUNTER — Other Ambulatory Visit: Payer: Self-pay | Admitting: Cardiology

## 2019-02-04 ENCOUNTER — Telehealth (HOSPITAL_COMMUNITY): Payer: Self-pay

## 2019-02-04 NOTE — Telephone Encounter (Signed)
New message    Just an FYI. We have made several attempts to contact this patient including sending a letter to schedule or reschedule their MYOCARDIAL PERFUSION  . We will be removing the patient from the WQ.   6.18.20 @ 9:40am lm on cell phone vm - Enola Siebers  6.3.20 @ 12:23pm lm on home vm Marialuiza Car  5.21.20 mail reminder letter Jari Sportsman

## 2019-02-04 NOTE — Telephone Encounter (Signed)
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

## 2019-02-06 NOTE — Telephone Encounter (Signed)
Spoke with the patient, he scheduled all of the testing.

## 2019-02-26 ENCOUNTER — Other Ambulatory Visit: Payer: Medicare Other

## 2019-03-11 ENCOUNTER — Encounter (HOSPITAL_COMMUNITY): Payer: Medicare Other

## 2019-03-15 ENCOUNTER — Other Ambulatory Visit: Payer: Self-pay | Admitting: Cardiology

## 2019-03-17 ENCOUNTER — Ambulatory Visit (HOSPITAL_COMMUNITY)
Admission: RE | Admit: 2019-03-17 | Discharge: 2019-03-17 | Disposition: A | Discharge status: Home / Self Care | Payer: Medicare Other | Source: Ambulatory Visit | Attending: Cardiology | Admitting: Cardiology

## 2019-03-17 ENCOUNTER — Other Ambulatory Visit: Payer: Self-pay

## 2019-03-17 DIAGNOSIS — I6523 Occlusion and stenosis of bilateral carotid arteries: Secondary | ICD-10-CM | POA: Diagnosis not present

## 2019-05-28 ENCOUNTER — Telehealth (HOSPITAL_COMMUNITY): Payer: Self-pay | Admitting: *Deleted

## 2019-05-28 NOTE — Telephone Encounter (Signed)

## 2019-06-02 ENCOUNTER — Encounter (INDEPENDENT_AMBULATORY_CARE_PROVIDER_SITE_OTHER): Payer: Self-pay

## 2019-06-02 ENCOUNTER — Ambulatory Visit (HOSPITAL_COMMUNITY): Payer: Medicare Other | Attending: Cardiology

## 2019-06-02 ENCOUNTER — Other Ambulatory Visit: Payer: Self-pay

## 2019-06-02 DIAGNOSIS — I2510 Atherosclerotic heart disease of native coronary artery without angina pectoris: Secondary | ICD-10-CM | POA: Diagnosis not present

## 2019-06-02 LAB — MYOCARDIAL PERFUSION IMAGING
LV dias vol: 89 mL (ref 62–150)
LV sys vol: 28 mL
Peak HR: 62 {beats}/min
Rest HR: 46 {beats}/min
SDS: 1
SRS: 0
SSS: 1
TID: 1.06

## 2019-06-02 MED ORDER — TECHNETIUM TC 99M TETROFOSMIN IV KIT
10.1000 | PACK | Freq: Once | INTRAVENOUS | Status: AC | PRN
  Administered 2019-06-02: 10.1 via INTRAVENOUS
  Filled 2019-06-02: qty 11

## 2019-06-02 MED ORDER — TECHNETIUM TC 99M TETROFOSMIN IV KIT
30.7000 | PACK | Freq: Once | INTRAVENOUS | Status: AC | PRN
  Administered 2019-06-02: 30.7 via INTRAVENOUS
  Filled 2019-06-02: qty 31

## 2019-06-02 MED ORDER — REGADENOSON 0.4 MG/5ML IV SOLN
0.4000 mg | Freq: Once | INTRAVENOUS | Status: AC
  Administered 2019-06-02: 0.4 mg via INTRAVENOUS

## 2019-06-03 ENCOUNTER — Encounter: Payer: Self-pay | Admitting: *Deleted

## 2019-08-25 ENCOUNTER — Other Ambulatory Visit: Payer: Self-pay | Admitting: Cardiology

## 2020-01-30 ENCOUNTER — Other Ambulatory Visit: Payer: Self-pay | Admitting: Cardiology

## 2020-04-16 ENCOUNTER — Other Ambulatory Visit: Payer: Self-pay | Admitting: Cardiology

## 2020-04-25 ENCOUNTER — Other Ambulatory Visit: Payer: Self-pay | Admitting: Cardiology

## 2020-04-26 ENCOUNTER — Ambulatory Visit (INDEPENDENT_AMBULATORY_CARE_PROVIDER_SITE_OTHER): Payer: Medicare Other | Admitting: Physician Assistant

## 2020-04-26 ENCOUNTER — Other Ambulatory Visit: Payer: Self-pay

## 2020-04-26 ENCOUNTER — Encounter: Payer: Self-pay | Admitting: Physician Assistant

## 2020-04-26 VITALS — BP 130/82 | HR 62 | Ht 68.0 in | Wt 190.0 lb

## 2020-04-26 DIAGNOSIS — I2510 Atherosclerotic heart disease of native coronary artery without angina pectoris: Secondary | ICD-10-CM | POA: Diagnosis not present

## 2020-04-26 DIAGNOSIS — I6523 Occlusion and stenosis of bilateral carotid arteries: Secondary | ICD-10-CM | POA: Diagnosis not present

## 2020-04-26 DIAGNOSIS — E785 Hyperlipidemia, unspecified: Secondary | ICD-10-CM

## 2020-04-26 DIAGNOSIS — I10 Essential (primary) hypertension: Secondary | ICD-10-CM | POA: Diagnosis not present

## 2020-04-26 NOTE — Patient Instructions (Signed)
Medication Instructions:  Do not take Rosuvastatin for 2 weeks Send me a MyChart message after 2 weeks to let me know if your muscle pain improved or not.   Follow-Up: At [facility], you and your health needs are our priority.  As part of our continuing mission to provide you with exceptional heart care, we have created designated Provider Care Teams.  These Care Teams include your primary Cardiologist (physician) and Advanced Practice Providers (APPs -  Physician Assistants and Nurse Practitioners) who all work together to provide you with the care you need, when you need it.  We recommend signing up for the patient portal called "MyChart".  Sign up information is provided on this After Visit Summary.  MyChart is used to connect with patients for Virtual Visits (Telemedicine).  Patients are able to view lab/test results, encounter notes, upcoming appointments, etc.  Non-urgent messages can be sent to your provider as well.   To learn more about what you can do with MyChart, go to [email or web address].    Your next appointment:   12 month(s)  The format for your next appointment:   In Person  Provider:   You may see Fransico Him, MD or one of the following Advanced Practice Providers on your designated Care Team:    Melina Copa, PA-C  Ermalinda Barrios, PA-C

## 2020-04-26 NOTE — Progress Notes (Signed)
Cardiology Office Note:    Date:  04/26/2020   ID:  [name] [name], DOB 04-Apr-1951, MRN [id number]  PCP:  [name] [name].[name] Sa, MD  [place] Cardiologist:  [name] Him, MD   [place] Electrophysiologist:  None   Referring MD: [name] [name], [name] [name].[name] Sa, MD   Chief Complaint:  Follow-up (CAD)    Patient Profile:    [name] [name] is a 69 y.o. male with:   Coronary artery disease   S/p CABG in 2007  Cath 2018: 2/3 grafts patent  Myoview 05/2019: Low risk  Hypertension  Carotid artery stenosis  Hyperlipidemia  Prior CV studies: Myoview 06/02/2019 EF 69, no ischemia; low risk  Carotid US 03/17/2019 Bilateral ICA 1-39  Cardiac catheterization 07/18/2017 LAD ostial 70, proximal 90 LCx ostial 30 RCA ostial 40, proximal 95 SVG-distal RCA patent SVG-OM2 100 CTO LIMA-LAD patent  Exercise tolerance test 07/16/2017 ECG changes consistent with ischemia   History of Present Illness:    [name] [name] was last seen by Dr. Radford [name] via telemedicine in 12/2018.    He returns for follow-up.  He is here alone.  Overall, he has been doing well without chest discomfort, shortness of breath, syncope, orthopnea, leg swelling.  Past Medical History:  Diagnosis Date  . Bilateral carotid artery stenosis    1-39% by dopplers 03/2017  . CAD (coronary artery disease), native coronary artery 08/28/2016   s/p CABG  . Depression    Dr [name] [name]  . Dyslipidemia   . Hypertension   . Melanoma ([place]) 2010   of the scalp Dr [name] [name]    Current Medications: Current Meds  Medication Sig  . aspirin 81 MG tablet Take 1 tablet (81 mg total) by mouth daily.  . Calcium Carb-Cholecalciferol (CALCIUM 500 +D PO) Take 1 tablet by mouth daily.  Marland Kitchen ezetimibe (ZETIA) 10 MG tablet TAKE 1 TABLET BY MOUTH EVERY DAY. MAKE APPOINTMENT FOR FURTHER REFILLS  . fluticasone (FLONASE) 50 MCG/ACT nasal spray Place 1 spray into both nostrils as needed for allergies or rhinitis.  Marland Kitchen loratadine (CLARITIN) 10 MG  tablet Take 10 mg by mouth daily.  . metoprolol succinate (TOPROL-XL) 25 MG 24 hr tablet TAKE 1 TABLET BY MOUTH EVERY DAY  . Omega-3 Fatty Acids (FISH OIL) 1000 MG CAPS Take 1 capsule by mouth 2 (two) times daily.  . rosuvastatin (CRESTOR) 40 MG tablet Take 1 tablet (40 mg total) by mouth daily. Please keep upcoming appt in September before anymore refills. Thank you  . vitamin C (ASCORBIC ACID) 500 MG tablet Take 1,000 mg by mouth daily.      Allergies:   Penicillins   Social History   Tobacco Use  . Smoking status: Former Smoker    Quit date: 08/14/1988    Years since quitting: 31.7  . Smokeless tobacco: Never Used  Substance Use Topics  . Alcohol use: Yes    Comment: 5 per week  . Drug use: No     Family Hx: The patient's family history includes Heart disease in his father.  Review of Systems  Cardiovascular: Negative for claudication.  Musculoskeletal: Positive for myalgias (notes occasionally in arms and legs).     EKGs/Labs/Other Test Reviewed:    EKG:  EKG is  ordered today.  The ekg ordered today demonstrates sinus bradycardia, HR 57, normal axis, nonspecific ST-T wave changes, QTC 424, no change from prior tracing  Recent Labs: No results found for requested labs within last 8760 hours.   Recent Lipid Panel Lab Results  Component  Value Date/Time   CHOL 124 11/20/2016 03:41 PM   TRIG 160 (H) 11/20/2016 03:41 PM   HDL 40 11/20/2016 03:41 PM   CHOLHDL 3.1 11/20/2016 03:41 PM   CHOLHDL 4 01/12/2015 12:19 PM   LDLCALC 52 11/20/2016 03:41 PM    Physical Exam:    VS:  BP 130/82   Pulse 62   Ht 5\' 8"  (1.727 m)   Wt 190 lb (86.2 kg)   SpO2 95%   BMI 28.89 kg/m     Wt Readings from Last 3 Encounters:  04/26/20 190 lb (86.2 kg)  06/02/19 187 lb (84.8 kg)  12/18/18 187 lb 6.4 oz (85 kg)     Constitutional:      Appearance: Healthy appearance. Not in distress.  Neck:     Thyroid: No thyromegaly.     Vascular: No carotid bruit. JVD normal.  Pulmonary:      Effort: Pulmonary effort is normal.     Breath sounds: No wheezing. No rales.  Cardiovascular:     Normal rate. Regular rhythm. Normal S1. Normal S2.     Murmurs: There is no murmur.  Pulses:    Intact distal pulses.  Edema:    Peripheral edema absent.  Abdominal:     Palpations: Abdomen is soft. There is no hepatomegaly.  Skin:    General: Skin is warm and dry.  Neurological:     General: No focal deficit present.     Mental Status: Alert and oriented to person, place and time.     Cranial Nerves: Cranial nerves are intact.       ASSESSMENT & PLAN:    1. Coronary artery disease involving native coronary artery of native heart without angina pectoris History of CABG in 2007.  Cardiac catheterization in 2018 with 2/3 grafts patent.  Nuclear stress test in October 2020 was low risk.  He is doing well without anginal symptoms.  Continue aspirin, ezetimibe, metoprolol succinate.  2. Essential hypertension, benign The patient's blood pressure is controlled on his current regimen.  Continue current therapy.   3. Dyslipidemia He notes diffuse myalgias from time to time.  I have asked [name] to stop taking rosuvastatin for 2 weeks and apprise me of his symptoms via MyChart.  If his symptoms do not improve, I will have [name] resume rosuvastatin and follow-up with primary care.  If he has improvement, we will try [name] back on rosuvastatin 20 mg daily and repeat a CMET, lipids in 3 months.  4. Bilateral carotid artery stenosis He is due for follow-up in 2022.  Continue aspirin.    Dispo:  Return in about 1 year (around 04/26/2021) for Routine Follow Up w/ Dr. Radford [name] , in person.   Medication Adjustments/Labs and Tests Ordered: Current medicines are reviewed at length with the patient today.  Concerns regarding medicines are outlined above.  Tests Ordered: Orders Placed This Encounter  Procedures  . EKG 12-Lead   Medication Changes: No orders of the defined types were placed in this  encounter.   Signed, [name] Dopp, PA-C  04/26/2020 3:32 PM    [facility] [place], [place], [place]  [address] Phone: [phone]; Fax: [phone]

## 2020-05-03 DIAGNOSIS — I2510 Atherosclerotic heart disease of native coronary artery without angina pectoris: Secondary | ICD-10-CM | POA: Diagnosis not present

## 2020-05-03 DIAGNOSIS — Z23 Encounter for immunization: Secondary | ICD-10-CM | POA: Diagnosis not present

## 2020-05-03 DIAGNOSIS — Z1159 Encounter for screening for other viral diseases: Secondary | ICD-10-CM | POA: Diagnosis not present

## 2020-05-03 DIAGNOSIS — E7800 Pure hypercholesterolemia, unspecified: Secondary | ICD-10-CM | POA: Diagnosis not present

## 2020-05-03 DIAGNOSIS — Z125 Encounter for screening for malignant neoplasm of prostate: Secondary | ICD-10-CM | POA: Diagnosis not present

## 2020-05-03 DIAGNOSIS — Z0000 Encounter for general adult medical examination without abnormal findings: Secondary | ICD-10-CM | POA: Diagnosis not present

## 2020-05-12 NOTE — Progress Notes (Signed)
LDL is elevated on labs from PCP - please find out if he felt better off statin

## 2020-05-31 ENCOUNTER — Other Ambulatory Visit: Payer: Self-pay | Admitting: Cardiology

## 2020-06-11 DIAGNOSIS — H5203 Hypermetropia, bilateral: Secondary | ICD-10-CM | POA: Diagnosis not present

## 2020-06-11 DIAGNOSIS — H43813 Vitreous degeneration, bilateral: Secondary | ICD-10-CM | POA: Diagnosis not present

## 2020-06-11 DIAGNOSIS — H524 Presbyopia: Secondary | ICD-10-CM | POA: Diagnosis not present

## 2020-06-11 DIAGNOSIS — H2513 Age-related nuclear cataract, bilateral: Secondary | ICD-10-CM | POA: Diagnosis not present

## 2020-07-15 ENCOUNTER — Other Ambulatory Visit: Payer: Self-pay | Admitting: Cardiology

## 2020-08-09 DIAGNOSIS — Z03818 Encounter for observation for suspected exposure to other biological agents ruled out: Secondary | ICD-10-CM | POA: Diagnosis not present

## 2020-08-09 DIAGNOSIS — R059 Cough, unspecified: Secondary | ICD-10-CM | POA: Diagnosis not present

## 2020-08-09 DIAGNOSIS — R509 Fever, unspecified: Secondary | ICD-10-CM | POA: Diagnosis not present

## 2020-08-10 ENCOUNTER — Other Ambulatory Visit (HOSPITAL_COMMUNITY): Payer: Self-pay | Admitting: Pulmonary Disease

## 2020-08-11 ENCOUNTER — Telehealth: Payer: Self-pay | Admitting: Unknown Physician Specialty

## 2020-08-11 NOTE — Telephone Encounter (Signed)
Called to Discuss with patient about Covid symptoms and the use of the monoclonal antibody infusion for those with mild to moderate Covid symptoms and at a high risk of hospitalization.     Pt appears to qualify for this infusion due to co-morbid conditions and/or a member of an at-risk group in accordance with the FDA Emergency Use Authorization.    Unable to reach pt   LMOM 

## 2020-08-11 NOTE — Telephone Encounter (Signed)
Called to discuss with [name] [name] about Covid symptoms and the use of  monoclonal antibody infusion for those with mild to moderate Covid symptoms and at a high risk of hospitalization.     Pt is qualified for this infusion due to co-morbid conditions and/or a member of an at-risk group, however is vaccinated and improving

## 2020-09-15 NOTE — Telephone Encounter (Signed)
Call in error

## 2020-11-27 DIAGNOSIS — Z23 Encounter for immunization: Secondary | ICD-10-CM | POA: Diagnosis not present

## 2021-03-17 ENCOUNTER — Telehealth: Payer: Self-pay | Admitting: Cardiology

## 2021-03-17 DIAGNOSIS — I2510 Atherosclerotic heart disease of native coronary artery without angina pectoris: Secondary | ICD-10-CM

## 2021-03-17 DIAGNOSIS — E785 Hyperlipidemia, unspecified: Secondary | ICD-10-CM

## 2021-03-17 NOTE — Telephone Encounter (Signed)
Left message for patient to call back  

## 2021-03-17 NOTE — Telephone Encounter (Signed)
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 please advise

## 2021-03-17 NOTE — Telephone Encounter (Signed)
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 I advised him that I have sent Dr. Radford Pax a message for orders and we would call him back.   Patient also states that when he saw Richardson Dopp, PA last year he was complaining of muscle aches and he was advised to stop his rosuvastatin to see if symptoms resolved. He states symptoms did resolve. He then saw his PCP who advised him to try atorvastatin. He was also unable to tolerate atorvastatin. He has not been on a statin for several months now. He continues to take zetia '10mg'$  daily. He would like to know if there are any other medications that he could try.

## 2021-03-29 ENCOUNTER — Telehealth: Payer: Self-pay | Admitting: Cardiology

## 2021-03-29 DIAGNOSIS — R072 Precordial pain: Secondary | ICD-10-CM

## 2021-03-29 NOTE — Telephone Encounter (Signed)
Patient would like to schedule his stress test but does not want to do the exercise because he is having problems with his legs. Can we get the order changed from exercise to [place]? Thank you!

## 2021-04-04 NOTE — Telephone Encounter (Signed)
Advised patient that stress test will be switched to [place]. He is currently scheduled for 9/19 at 10:45am. Will send note to [facility] department to see if patient can keep same appointment slot for [place].

## 2021-04-11 ENCOUNTER — Ambulatory Visit (INDEPENDENT_AMBULATORY_CARE_PROVIDER_SITE_OTHER): Payer: Medicare Other | Admitting: Pharmacist

## 2021-04-11 ENCOUNTER — Other Ambulatory Visit: Payer: Self-pay

## 2021-04-11 DIAGNOSIS — I2510 Atherosclerotic heart disease of native coronary artery without angina pectoris: Secondary | ICD-10-CM

## 2021-04-11 DIAGNOSIS — E785 Hyperlipidemia, unspecified: Secondary | ICD-10-CM | POA: Diagnosis not present

## 2021-04-11 MED ORDER — ROSUVASTATIN CALCIUM 40 MG PO TABS: 20.0000 mg | ORAL_TABLET | ORAL | 3 refills | Status: DC

## 2021-04-11 NOTE — Patient Instructions (Addendum)
Start taking rosuvastatin '20mg'$  three times a week Continue ezetimibe '10mg'$  daily  Try to reduce your sweet tea intake  Call me at [phone] with any issues

## 2021-04-11 NOTE — Progress Notes (Signed)
Patient ID: [name] [name]                 DOB: 06/30/1951                    MRN: [id number]     HPI: FUE STIPES is a 70 y.o. male patient referred to lipid clinic by Dr. Radford Pax. PMH is significant for CAD s/p CABG 2007, HTN, carotid artery stenosis, HLD. Patient saw Richardson Dopp in July of 2021. Complained of myalgias so he was advised to stop taking for 2 weeks. Myalgias improved. He saw his PCP who suggested he take rosuvastatin '40mg'$  three times a week. Myalgias returned so his PCP changed him to atorvastatin which he also has myalgias with. He was referred to lipid clinic.  Patient presents to lipid clinic today. 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 Does not get much exercise other than twisting down his gear a few times a day and yard work on the weekends. Doing fine on ezetimibe.  Current Medications: ezetimibe '10mg'$  daily Intolerances: rosuvastatin '40mg'$ , atorvastatin 20 mg and rosuvastatin '40mg'$  three times a week (pain in shoulders, back, legs) Risk Factors: CAD LDL goal: <70  Diet: breakfast- 1 egg, 1 sausage patty, 1 toast Black beans, salsa, corn w/ tortilla chips, sandwich Dinner: asian salad Snacks: pack of PB crackers, banana, orange and apple Drink: black coffee, 2 12 oz bottles of sweet tea, 16 oz unsweet tea, 4 bottles of water  Exercise: yard work on weekends  Family History:  Family History  Problem Relation Age of Onset   Heart disease Father     Social History: former smoker (quite 32 years ago), + ETOH 4-5 beers per week  Labs: 05/03/20 TC 201, HDL 51, TG 119, LDL 129 (zetia '10mg'$  daily)  Past Medical History:  Diagnosis Date   Bilateral carotid artery stenosis    1-39% by dopplers 03/2017   CAD (coronary artery disease), native coronary artery 08/28/2016   s/p CABG   Depression    Dr Charissa Bash   Dyslipidemia    Hypertension    Melanoma ([place]) 2010   of the scalp Dr Manuela Neptune    Current Outpatient Medications on File  Prior to Visit  Medication Sig Dispense Refill   aspirin 81 MG tablet Take 1 tablet (81 mg total) by mouth daily.     Calcium Carb-Cholecalciferol (CALCIUM 500 +D PO) Take 1 tablet by mouth daily.     ezetimibe (ZETIA) 10 MG tablet Take 1 tablet (10 mg total) by mouth daily. 90 tablet 3   fluticasone (FLONASE) 50 MCG/ACT nasal spray Place 1 spray into both nostrils as needed for allergies or rhinitis.     loratadine (CLARITIN) 10 MG tablet Take 10 mg by mouth daily.     metoprolol succinate (TOPROL-XL) 25 MG 24 hr tablet TAKE 1 TABLET BY MOUTH EVERY DAY 90 tablet 3   Omega-3 Fatty Acids (FISH OIL) 1000 MG CAPS Take 1 capsule by mouth 2 (two) times daily.     rosuvastatin (CRESTOR) 40 MG tablet TAKE 1 TABLET DAILY 90 tablet 2   vitamin C (ASCORBIC ACID) 500 MG tablet Take 1,000 mg by mouth daily.      No current facility-administered medications on file prior to visit.    Allergies  Allergen Reactions   Penicillins Rash    Has patient had a PCN reaction causing immediate rash, facial/tongue/throat swelling, SOB or lightheadedness with hypotension: no  Has patient had a PCN reaction causing severe rash involving mucus membranes or skin necrosis: no Has patient had a PCN reaction that required hospitalization: no Has patient had a PCN reaction occurring within the last 10 years: no If all of the above answers are "NO", then may proceed with Cephalosporin use.     Assessment/Plan:  1. Hyperlipidemia - LDL is above goal of <70. Discussed PCSK9i. Patient thinks he can afford but he has a ton of rosuvastatin at home and would like to re-challenge with it. He has the '40mg'$  tablets. I advised that we try rosuvastatin '20mg'$  three times a week. If patient does not tolerate, we can discuss either decreasing dose further or try PCSK9i. Patient to call me if he does not tolerate rosuvastatin. Recheck labs in 2 months.   Thank you,   Ramond Dial, Pharm.D, BCPS, CPP [place]  [id number], [place], [place] [address]  Phone: [phone]; Fax: [phone]

## 2021-04-25 ENCOUNTER — Telehealth (HOSPITAL_COMMUNITY): Payer: Self-pay | Admitting: *Deleted

## 2021-04-25 NOTE — Telephone Encounter (Signed)
Patient given detailed instructions per Myocardial Perfusion Study Information Sheet for the test on 05/02/21 at 10:45. Patient notified to arrive 15 minutes early and that it is imperative to arrive on time for appointment to keep from having the test rescheduled.  If you need to cancel or reschedule your appointment, please call the office within 24 hours of your appointment. . Patient verbalized understanding.Veronia Beets

## 2021-04-27 NOTE — Telephone Encounter (Signed)
Shared Decision Making/Informed Consent The risks [chest pain, shortness of breath, cardiac arrhythmias, dizziness, blood pressure fluctuations, myocardial infarction, stroke/transient ischemic attack, nausea, vomiting, allergic reaction, radiation exposure, metallic taste sensation and life-threatening complications (estimated to be 1 in 10,000)], benefits (risk stratification, diagnosing coronary artery disease, treatment guidance) and alternatives of a nuclear stress test were discussed in detail with [name] [name] and he agrees to proceed.

## 2021-05-02 ENCOUNTER — Other Ambulatory Visit: Payer: Self-pay

## 2021-05-02 ENCOUNTER — Ambulatory Visit (HOSPITAL_COMMUNITY): Payer: Medicare Other | Attending: Cardiovascular Disease

## 2021-05-02 DIAGNOSIS — R072 Precordial pain: Secondary | ICD-10-CM | POA: Diagnosis not present

## 2021-05-02 LAB — MYOCARDIAL PERFUSION IMAGING
Base ST Depression (mm): 0 mm
LV dias vol: 80 mL (ref 62–150)
LV sys vol: 50 mL
Nuc Stress EF: 63 %
Peak HR: 68 {beats}/min
Rest HR: 51 {beats}/min
Rest Nuclear Isotope Dose: 10.9 mCi
SDS: 3
SRS: 0
SSS: 3
Stress Nuclear Isotope Dose: 32.2 mCi
TID: 1.05

## 2021-05-02 MED ORDER — REGADENOSON 0.4 MG/5ML IV SOLN
0.4000 mg | Freq: Once | INTRAVENOUS | Status: AC
  Administered 2021-05-02: 0.4 mg via INTRAVENOUS

## 2021-05-02 MED ORDER — TECHNETIUM TC 99M TETROFOSMIN IV KIT
32.2000 | PACK | Freq: Once | INTRAVENOUS | Status: AC | PRN
  Administered 2021-05-02: 32.2 via INTRAVENOUS
  Filled 2021-05-02: qty 33

## 2021-05-02 MED ORDER — TECHNETIUM TC 99M TETROFOSMIN IV KIT
10.9000 | PACK | Freq: Once | INTRAVENOUS | Status: AC | PRN
  Administered 2021-05-02: 10.9 via INTRAVENOUS
  Filled 2021-05-02: qty 11

## 2021-05-09 ENCOUNTER — Other Ambulatory Visit: Payer: Self-pay | Admitting: Cardiology

## 2021-05-09 ENCOUNTER — Other Ambulatory Visit: Payer: Self-pay

## 2021-05-09 MED ORDER — METOPROLOL SUCCINATE ER 25 MG PO TB24: 25.0000 mg | ORAL_TABLET | Freq: Every day | ORAL | 0 refills | Status: DC

## 2021-05-30 ENCOUNTER — Other Ambulatory Visit: Payer: Self-pay | Admitting: Family Medicine

## 2021-05-30 ENCOUNTER — Ambulatory Visit
Admission: RE | Admit: 2021-05-30 | Discharge: 2021-05-30 | Disposition: A | Discharge status: Home / Self Care | Payer: Medicare Other | Source: Ambulatory Visit | Attending: Family Medicine | Admitting: Family Medicine

## 2021-05-30 DIAGNOSIS — K219 Gastro-esophageal reflux disease without esophagitis: Secondary | ICD-10-CM | POA: Diagnosis not present

## 2021-05-30 DIAGNOSIS — M25552 Pain in left hip: Secondary | ICD-10-CM | POA: Diagnosis not present

## 2021-05-30 DIAGNOSIS — Z23 Encounter for immunization: Secondary | ICD-10-CM | POA: Diagnosis not present

## 2021-05-30 DIAGNOSIS — Z0000 Encounter for general adult medical examination without abnormal findings: Secondary | ICD-10-CM | POA: Diagnosis not present

## 2021-05-30 DIAGNOSIS — E7800 Pure hypercholesterolemia, unspecified: Secondary | ICD-10-CM | POA: Diagnosis not present

## 2021-05-30 DIAGNOSIS — I2510 Atherosclerotic heart disease of native coronary artery without angina pectoris: Secondary | ICD-10-CM | POA: Diagnosis not present

## 2021-06-06 ENCOUNTER — Telehealth: Payer: Self-pay | Admitting: Pharmacist

## 2021-06-06 NOTE — Telephone Encounter (Signed)
Called patient to follow up on how patient was going on rosuvastatin 20mg  three times a week. He states he has low grade pain in back. But he did find out some of his pain is from arthritis. LDL at PCP 10/17 was 98. He is agreeable to increasing to rosuvastatin 20mg  every other week.

## 2021-06-30 ENCOUNTER — Telehealth: Payer: Self-pay

## 2021-06-30 DIAGNOSIS — E785 Hyperlipidemia, unspecified: Secondary | ICD-10-CM

## 2021-06-30 NOTE — Telephone Encounter (Signed)
Patient has been adherent to zetia 10 mg daily and has been taking rosuvastatin 20 mg every other day. Referral has been placed to lipid clinic to discuss further options.

## 2021-06-30 NOTE — Telephone Encounter (Signed)
-----   Message from Leeroy Bock, [place] sent at 06/23/2021 10:23 AM EST ----- Would confirm if pt is adherent to ezetimibe 10mg  daily and rosuvastatin 20mg  3x per week (dispense report shows rosuvastatin hasn't been filled but isn't always 100% correct, does show he filled ezetimibe though). If he's not taking his statin, would advise him to start and recheck lipids in 6 weeks. If he's taking it or was intolerant, would refer him to lipid clinic for in office visit to discuss other med options.

## 2021-07-06 ENCOUNTER — Encounter: Payer: Self-pay | Admitting: Pharmacist

## 2021-07-11 ENCOUNTER — Ambulatory Visit: Payer: Medicare Other | Admitting: Cardiology

## 2021-07-20 ENCOUNTER — Encounter: Payer: Self-pay | Admitting: Cardiology

## 2021-07-25 ENCOUNTER — Other Ambulatory Visit: Payer: Self-pay | Admitting: Cardiology

## 2021-08-04 ENCOUNTER — Other Ambulatory Visit: Payer: Self-pay | Admitting: Cardiology

## 2021-08-24 ENCOUNTER — Other Ambulatory Visit: Payer: Self-pay | Admitting: Cardiology

## 2021-08-26 ENCOUNTER — Other Ambulatory Visit: Payer: Self-pay | Admitting: Cardiology

## 2021-08-29 ENCOUNTER — Encounter: Payer: Self-pay | Admitting: Pharmacist

## 2021-09-05 ENCOUNTER — Other Ambulatory Visit: Payer: Self-pay | Admitting: Cardiology

## 2021-09-06 MED ORDER — METOPROLOL SUCCINATE ER 25 MG PO TB24: 25.0000 mg | ORAL_TABLET | Freq: Every day | ORAL | 0 refills | Status: DC

## 2021-11-10 ENCOUNTER — Other Ambulatory Visit: Payer: Self-pay | Admitting: Cardiology

## 2021-12-04 ENCOUNTER — Other Ambulatory Visit: Payer: Self-pay | Admitting: Cardiology

## 2021-12-17 ENCOUNTER — Ambulatory Visit (INDEPENDENT_AMBULATORY_CARE_PROVIDER_SITE_OTHER): Payer: Medicare Other

## 2021-12-17 ENCOUNTER — Encounter (HOSPITAL_COMMUNITY): Payer: Self-pay | Admitting: Emergency Medicine

## 2021-12-17 ENCOUNTER — Ambulatory Visit (HOSPITAL_COMMUNITY)
Admission: EM | Admit: 2021-12-17 | Discharge: 2021-12-17 | Disposition: A | Discharge status: Home / Self Care | Payer: Medicare Other | Attending: Internal Medicine | Admitting: Internal Medicine

## 2021-12-17 ENCOUNTER — Other Ambulatory Visit: Payer: Self-pay

## 2021-12-17 ENCOUNTER — Emergency Department (HOSPITAL_COMMUNITY)
Admission: EM | Admit: 2021-12-17 | Discharge: 2021-12-17 | Disposition: A | Discharge status: Home / Self Care | Payer: Medicare Other | Attending: Emergency Medicine | Admitting: Emergency Medicine

## 2021-12-17 DIAGNOSIS — S61212A Laceration without foreign body of right middle finger without damage to nail, initial encounter: Secondary | ICD-10-CM | POA: Diagnosis not present

## 2021-12-17 DIAGNOSIS — S62656B Nondisplaced fracture of medial phalanx of right little finger, initial encounter for open fracture: Secondary | ICD-10-CM | POA: Diagnosis not present

## 2021-12-17 DIAGNOSIS — S67196A Crushing injury of right little finger, initial encounter: Secondary | ICD-10-CM

## 2021-12-17 DIAGNOSIS — Z23 Encounter for immunization: Secondary | ICD-10-CM | POA: Diagnosis not present

## 2021-12-17 DIAGNOSIS — W3189XA Contact with other specified machinery, initial encounter: Secondary | ICD-10-CM | POA: Diagnosis not present

## 2021-12-17 DIAGNOSIS — S6991XA Unspecified injury of right wrist, hand and finger(s), initial encounter: Secondary | ICD-10-CM | POA: Diagnosis present

## 2021-12-17 DIAGNOSIS — M79644 Pain in right finger(s): Secondary | ICD-10-CM | POA: Insufficient documentation

## 2021-12-17 DIAGNOSIS — M7989 Other specified soft tissue disorders: Secondary | ICD-10-CM | POA: Diagnosis not present

## 2021-12-17 DIAGNOSIS — Z7982 Long term (current) use of aspirin: Secondary | ICD-10-CM | POA: Insufficient documentation

## 2021-12-17 DIAGNOSIS — S62666B Nondisplaced fracture of distal phalanx of right little finger, initial encounter for open fracture: Secondary | ICD-10-CM

## 2021-12-17 DIAGNOSIS — S62656A Nondisplaced fracture of medial phalanx of right little finger, initial encounter for closed fracture: Secondary | ICD-10-CM | POA: Diagnosis not present

## 2021-12-17 DIAGNOSIS — T148XXA Other injury of unspecified body region, initial encounter: Secondary | ICD-10-CM

## 2021-12-17 DIAGNOSIS — W272XXA Contact with scissors, initial encounter: Secondary | ICD-10-CM | POA: Insufficient documentation

## 2021-12-17 MED ORDER — LIDOCAINE HCL 2 % IJ SOLN
15.0000 mL | Freq: Once | INTRAMUSCULAR | Status: AC
  Administered 2021-12-17: 300 mg
  Filled 2021-12-17: qty 20

## 2021-12-17 MED ORDER — TETANUS-DIPHTH-ACELL PERTUSSIS 5-2.5-18.5 LF-MCG/0.5 IM SUSY
0.5000 mL | PREFILLED_SYRINGE | Freq: Once | INTRAMUSCULAR | Status: AC
Start: 2021-12-17 — End: 2021-12-17
  Administered 2021-12-17: 0.5 mL via INTRAMUSCULAR
  Filled 2021-12-17: qty 0.5

## 2021-12-17 MED ORDER — LIDOCAINE HCL 2 % IJ SOLN: 15.0000 mL | Freq: Once | INTRAMUSCULAR | Status: DC

## 2021-12-17 NOTE — ED Triage Notes (Signed)
Pt reports working on pump on hydraulic table when right 5th finger got smashed. Pt has lacerations to 5th finger.  ?

## 2021-12-17 NOTE — ED Provider Notes (Signed)
Reason ?[facility] ?Provider Note ? ? ?CSN: [id number] ?Arrival date & time: 12/17/21  1917 ? ?  ? ?History ? ?Chief Complaint  ?Patient presents with  ? Finger Injury  ? ? ?RISHIK TUBBY is a 71 y.o. male who presents to the ED from urgent care for evaluation of a right open pinky fracture.  Patient states that his pinky got caught underneath a split scissor table as it fell down, lacerating the volar aspect of his right pinky.  X-rays done at urgent care show equivocal middle phalanx fracture, however given the laceration is considered open fracture and he was referred to the emergency department.  Patient is not on blood thinners.  No comorbidities affecting blood healing.  He notes its been about 9 years since his last tetanus vaccine.  Denies numbness, tingling.  Bleeding well controlled with gauze.  No other systemic complaints. ? ?HPI ? ?  ? ?Home Medications ?Prior to Admission medications   ?Medication Sig Start Date End Date Taking? Authorizing Provider  ?aspirin 81 MG tablet Take 1 tablet (81 mg total) by mouth daily. 02/08/15   Sueanne Margarita, MD  ?Calcium Carb-Cholecalciferol (CALCIUM 500 +D PO) Take 1 tablet by mouth daily.    [provider]  ?ezetimibe (ZETIA) 10 MG tablet Take 1 tablet (10 mg total) by mouth daily. PLEASE KEEP UPCOMING APPT FOR FUTURE REFILLS THANK YOU 1ST ATTEMPT 05/09/21   Sueanne Margarita, MD  ?fluticasone (FLONASE) 50 MCG/ACT nasal spray Place 1 spray into both nostrils as needed for allergies or rhinitis.    [provider]  ?loratadine (CLARITIN) 10 MG tablet Take 10 mg by mouth daily.    [provider]  ?metoprolol succinate (TOPROL-XL) 25 MG 24 hr tablet TAKE 1 TABLET DAILY. PT. MAKE APPT. WITH CARDIOLOGIST IN ORDER TO RECEIVE FUTURE REFILLS 12/06/21   Sueanne Margarita, MD  ?Omega-3 Fatty Acids (FISH OIL) 1000 MG CAPS Take 1 capsule by mouth 2 (two) times daily.    [provider]  ?rosuvastatin (CRESTOR)  40 MG tablet TAKE 1/2 TABLET 3 TIMES A  WEEK. PLEASE MAKE AN       APPOINTMENT WITH YOUR      DOCTOR 08/25/21   Sueanne Margarita, MD  ?vitamin C (ASCORBIC ACID) 500 MG tablet Take 1,000 mg by mouth daily.     [provider]  ?   ? ?Allergies    ?Penicillins   ? ?Review of Systems   ?Review of Systems ? ?Physical Exam ?Updated Vital Signs ?BP (!) 149/75 (BP Location: Left Arm)   Pulse 60   Temp 98.8 ?F (37.1 ?C) (Oral)   Resp 18   Ht '5\' 8"'$  (1.727 m)   Wt 86.2 kg   SpO2 99%   BMI 28.89 kg/m?  ?Physical Exam ?Vitals and nursing note reviewed.  ?Constitutional:   ?   General: He is not in acute distress. ?   Appearance: He is not ill-appearing.  ?HENT:  ?   Head: Atraumatic.  ?Eyes:  ?   Conjunctiva/sclera: Conjunctivae normal.  ?Cardiovascular:  ?   Rate and Rhythm: Normal rate and regular rhythm.  ?   Pulses: Normal pulses.     ?     Radial pulses are 2+ on the right side and 2+ on the left side.  ?   Heart sounds: No murmur heard. ?Pulmonary:  ?   Effort: Pulmonary effort is normal. No respiratory distress.  ?   Breath  sounds: Normal breath sounds.  ?Abdominal:  ?   General: Abdomen is flat. There is no distension.  ?   Palpations: Abdomen is soft.  ?   Tenderness: There is no abdominal tenderness.  ?Musculoskeletal:     ?   General: Normal range of motion.  ?   Cervical back: Normal range of motion.  ?   Comments: 1.5 cm laceration of the right aspect of the right pinky.  Brisk capillary refill, sensation intact.  Full ROM of the pinky with good thumb to pinky opposition.  ?Skin: ?   General: Skin is warm and dry.  ?   Capillary Refill: Capillary refill takes less than 2 seconds.  ?Neurological:  ?   General: No focal deficit present.  ?   Mental Status: He is alert.  ?Psychiatric:     ?   Mood and Affect: Mood normal.  ? ? ? ?ED Results / Procedures / Treatments   ?Labs ?(all labs ordered are listed, but only abnormal results are displayed) ?Labs Reviewed - No data to  display ? ?EKG ?None ? ?Radiology ?DG Finger Little Right ? ?Result Date: 12/17/2021 ?CLINICAL DATA:  Acute RIGHT little finger injury and pain. EXAM: RIGHT LITTLE FINGER 2+V COMPARISON:  None Available. FINDINGS: There is a linear lucency within the mid aspect of the middle phalanx with somewhat sharp borders and equivocal for a nondisplaced fracture. No other fracture, subluxation or dislocation identified. Soft tissue swelling is present. IMPRESSION: 1. Equivocal nondisplaced fracture of the middle phalanx. 2. Soft tissue swelling. No other acute bony abnormalities. 3. Consider follow-up radiographs in 7-10 days. Electronically Signed   By: Margarette Canada M.D.   On: 12/17/2021 18:46   ? ?Procedures ?Marland Kitchen.Laceration Repair ? ?Date/Time: 12/17/2021 10:27 PM ?Performed by: Tonye Pearson, PA-C ?Authorized by: Tonye Pearson, PA-C  ? ?Consent:  ?  Consent obtained:  Verbal ?  Consent given by:  Patient ?  Risks discussed:  Infection, need for additional repair, pain, poor cosmetic result and poor wound healing ?  Alternatives discussed:  No treatment and delayed treatment ?Universal protocol:  ?  Procedure explained and questions answered to patient or proxy's satisfaction: yes   ?  Relevant documents present and verified: yes   ?  Test results available: yes   ?  Imaging studies available: yes   ?  Required blood products, implants, devices, and special equipment available: yes   ?  Site/side marked: yes   ?  Immediately prior to procedure, a time out was called: yes   ?  Patient identity confirmed:  Verbally with patient ?Anesthesia:  ?  Anesthesia method:  Nerve block ?  Block location:  Right pinky was ?  Block needle gauge:  25 G ?  Block anesthetic:  Lidocaine 2% w/o epi ?  Block technique:  Single injection ?  Block injection procedure:  Introduced needle, anatomic landmarks identified, negative aspiration for blood and incremental injection ?  Block outcome:  Anesthesia achieved ?Laceration details:  ?  Location:   Finger ?  Finger location:  R small finger ?  Length (cm):  1.5 ?  Depth (mm):  3 ?Exploration:  ?  Limited defect created (wound extended): no   ?  Hemostasis achieved with:  Direct pressure ?  Imaging obtained: x-ray   ?  Imaging outcome: foreign body not noted   ?  Contaminated: no   ?Treatment:  ?  Area cleansed with:  Povidone-iodine ?  Amount of cleaning:  Extensive ?  Irrigation solution:  Sterile saline ?  Irrigation volume:  1068m ?  Irrigation method:  Syringe ?  Visualized foreign bodies/material removed: no   ?  Debridement:  None ?  Undermining:  None ?  Scar revision: no   ?Skin repair:  ?  Repair method:  Sutures ?  Suture size:  5-0 ?  Suture material:  Prolene ?  Suture technique:  Simple interrupted ?  Number of sutures:  3 ?Approximation:  ?  Approximation:  Close ?Repair type:  ?  Repair type:  Simple ?Post-procedure details:  ?  Dressing:  Antibiotic ointment and bulky dressing ?  Procedure completion:  Tolerated  ? ? ?Medications Ordered in ED ?Medications  ?Tdap (BOOSTRIX) injection 0.5 mL (0.5 mLs Intramuscular Given 12/17/21 2120)  ?lidocaine (XYLOCAINE) 2 % (with pres) injection 300 mg (300 mg Infiltration Given 12/17/21 2120)  ? ? ?ED Course/ Medical Decision Making/ A&P ?  ?                        ?Medical Decision Making ?Risk ?Prescription drug management. ? ? ?Pressure irrigation performed. Wound explored and base of wound visualized in a bloodless field without evidence of foreign body.  Laceration occurred < 8 hours prior to repair which was well tolerated.  Tdap updated.  Pt has  no comorbidities to effect normal wound healing. Pt discharged  without antibiotics.  Discussed suture home care with patient and answered questions. Pt to follow-up for wound check and suture removal in 7 days; they are to return to the ED sooner for signs of infection.  Given that this is an open fracture of the hand, my attending Dr. MSabra Heckspoke with Dr. BTempie Donningwith hand surgery to discuss her plan of  irrigation, repair and follow-up outpatient.  He states that this is a reasonable plan.  Pt is hemodynamically stable with no complaints prior to dc.  ? ? ?Final Clinical Impression(s) / ED Diagnoses ?Final diagnoses:  ?Open nondisplaced fra

## 2021-12-17 NOTE — ED Triage Notes (Signed)
Pt sent from Urgent Care, his 5th finger was "pinched" by a hydraulic table today and Xray shows a fracture.  Wound is open but bleeding is controlled.  ?

## 2021-12-17 NOTE — Discharge Instructions (Addendum)
1. Medications: Tylenol or ibuprofen for pain, usual home medications 2. Treatment: ice for swelling, keep wound clean with warm soap and water and keep bandage dry, do not submerge in water for 24 hours 3. Follow Up: Please return or follow up with primary care provider in 7 days to have your stitches/staples removed or sooner if you have concerns. Return to the emergency department for increased redness, drainage of pus from the wound, or fevers/chills.   WOUND CARE  Keep area clean and dry for 24 hours. Do not remove bandage, if applied.  After 24 hours, remove bandage and wash wound gently with mild soap and warm water. Reapply a new bandage after cleaning wound, if directed.   Continue daily cleansing with soap and water until stitches/staples are removed.  Do not apply any ointments or creams to the wound while stitches/staples are in place, as this may cause delayed healing. Return if you experience any of the following signs of infection: Swelling, redness, pus drainage, streaking, fever >101.0 F  Return if you experience excessive bleeding that does not stop after 15-20 minutes of constant, firm pressure.  

## 2021-12-17 NOTE — Discharge Instructions (Addendum)
-   Please go directly to the emergency room after leaving urgent care for further work-up and evaluation of your right pinky injury ?

## 2021-12-17 NOTE — ED Notes (Signed)
Patient is being discharged from the Urgent Care and sent to the Emergency Department via POV . Per Noemi Chapel, NP patient is in need of higher level of care due to fractured finger. Patient is aware and verbalizes understanding of plan of care.  ?Vitals:  ? 12/17/21 1828  ?BP: (!) 150/80  ?Pulse: (!) 56  ?Resp: 18  ?Temp: 98.5 ?F (36.9 ?C)  ?SpO2: 98%  ? ? ?

## 2021-12-17 NOTE — ED Provider Notes (Signed)
?[facility] ? ? ? ?CSN: [id number] ?Arrival date & time: 12/17/21  1742 ? ? ?  ? ?History   ?Chief Complaint ?Chief Complaint  ?Patient presents with  ? Finger Injury  ? ? ?HPI ?[name] [name] is a 71 y.o. male.  ? ?Patient reports right pinky finger pain after getting his finger crushed in a scissor pump/hydraulic table today while working.  He reports he was unable to remove his finger at first because it was so badly crushed.  He reports some numbness/tingling to the distal part of his pinky.  It is also swollen and painful and there is a laceration. ? ? ?Past Medical History:  ?Diagnosis Date  ? Bilateral carotid artery stenosis   ? 1-39% by dopplers 03/2017  ? CAD (coronary artery disease), native coronary artery 08/28/2016  ? s/p CABG  ? Depression   ? Dr Charissa Bash  ? Dyslipidemia   ? Hypertension   ? Melanoma ([place]) 2010  ? of the scalp Dr Wendee Beavers Harvel Quale  ? ? ?Patient Active Problem List  ? Diagnosis Date Noted  ? Abnormal stress test   ? CAD (coronary artery disease), native coronary artery 08/28/2016  ? Ankle fracture 03/23/2015  ? Carotid artery stenosis 03/20/2015  ? Hx of CABG   ? Dyslipidemia 08/04/2013  ? Essential hypertension, benign 08/04/2013  ? Encounter for long-term (current) use of other medications 08/04/2013  ? ? ?Past Surgical History:  ?Procedure Laterality Date  ? CORONARY ARTERY BYPASS GRAFT  2007  ? FRACTURE SURGERY    ? ankle fracture aug 2016  ? LEFT HEART CATH AND CORS/GRAFTS ANGIOGRAPHY N/A 07/18/2017  ? Procedure: LEFT HEART CATH AND CORS/GRAFTS ANGIOGRAPHY;  Surgeon: Burnell Blanks, MD;  Location: [facility];  Service: Cardiovascular;  Laterality: N/A;  ? ORIF ANKLE FRACTURE Left 03/23/2015  ? Procedure: OPEN REDUCTION INTERNAL FIXATION (ORIF) ANKLE FRACTURE;  Surgeon: Marybelle Killings, MD;  Location: [facility];  Service: Orthopedics;  Laterality: Left;  ? ? ? ? ? ?Home Medications   ? ?Prior to Admission medications   ?Medication Sig Start Date End Date Taking?  Authorizing Provider  ?aspirin 81 MG tablet Take 1 tablet (81 mg total) by mouth daily. 02/08/15   Sueanne Margarita, MD  ?Calcium Carb-Cholecalciferol (CALCIUM 500 +D PO) Take 1 tablet by mouth daily.    [provider]  ?ezetimibe (ZETIA) 10 MG tablet Take 1 tablet (10 mg total) by mouth daily. PLEASE KEEP UPCOMING APPT FOR FUTURE REFILLS THANK YOU 1ST ATTEMPT 05/09/21   Sueanne Margarita, MD  ?fluticasone (FLONASE) 50 MCG/ACT nasal spray Place 1 spray into both nostrils as needed for allergies or rhinitis.    [provider]  ?loratadine (CLARITIN) 10 MG tablet Take 10 mg by mouth daily.    [provider]  ?metoprolol succinate (TOPROL-XL) 25 MG 24 hr tablet TAKE 1 TABLET DAILY. PT. MAKE APPT. WITH CARDIOLOGIST IN ORDER TO RECEIVE FUTURE REFILLS 12/06/21   Sueanne Margarita, MD  ?Omega-3 Fatty Acids (FISH OIL) 1000 MG CAPS Take 1 capsule by mouth 2 (two) times daily.    [provider]  ?rosuvastatin (CRESTOR) 40 MG tablet TAKE 1/2 TABLET 3 TIMES A  WEEK. PLEASE MAKE AN       APPOINTMENT WITH YOUR      DOCTOR 08/25/21   Sueanne Margarita, MD  ?vitamin C (ASCORBIC ACID) 500 MG tablet Take 1,000 mg by mouth daily.     [provider]  ? ? ?  Family History ?Family History  ?Problem Relation Age of Onset  ? Heart disease Father   ? ? ?Social History ?Social History  ? ?Tobacco Use  ? Smoking status: Former  ?  Types: Cigarettes  ?  Quit date: 08/14/1988  ?  Years since quitting: 33.3  ? Smokeless tobacco: Never  ?Substance Use Topics  ? Alcohol use: Yes  ?  Comment: 5 per week  ? Drug use: No  ? ? ? ?Allergies   ?Penicillins ? ? ?Review of Systems ?Review of Systems ?Per HPI ? ?Physical Exam ?Triage Vital Signs ?ED Triage Vitals  ?Enc Vitals Group  ?   BP 12/17/21 1828 (!) 150/80  ?   Pulse Rate 12/17/21 1828 (!) 56  ?   Resp 12/17/21 1828 18  ?   Temp 12/17/21 1828 98.5 ?F (36.9 ?C)  ?   Temp Source 12/17/21 1827 Oral  ?   SpO2 12/17/21 1828 98 %  ?   Weight --   ?   Height --   ?    Head Circumference --   ?   Peak Flow --   ?   Pain Score 12/17/21 1825 5  ?   Pain Loc --   ?   Pain Edu? --   ?   Excl. in [place]? --   ? ?No data found. ? ?Updated Vital Signs ?BP (!) 150/80 (BP Location: Left Arm)   Pulse (!) 56   Temp 98.5 ?F (36.9 ?C)   Resp 18   SpO2 98%  ? ?Visual Acuity ?Right Eye Distance:   ?Left Eye Distance:   ?Bilateral Distance:   ? ?Right Eye Near:   ?Left Eye Near:    ?Bilateral Near:    ? ?Physical Exam ?Vitals and nursing note reviewed.  ?Constitutional:   ?   General: He is not in acute distress. ?   Appearance: Normal appearance. He is not toxic-appearing.  ?Pulmonary:  ?   Effort: Pulmonary effort is normal. No respiratory distress.  ?Musculoskeletal:  ?   Left hand: Normal.  ?   Comments: Decreased flexion of right DIP joint; there is obvious swelling and redness around PIP joint   ?Skin: ?   General: Skin is warm and dry.  ?   Capillary Refill: Capillary refill takes less than 2 seconds.  ?   Coloration: Skin is not jaundiced or pale.  ?   Findings: Bruising (posterior right pinky finger), erythema (right pinky finger) and laceration (right pinky finger anteriorly) present.  ?Neurological:  ?   Mental Status: He is alert and oriented to person, place, and time.  ?   Motor: No weakness.  ?   Gait: Gait normal.  ?Psychiatric:     ?   Behavior: Behavior is cooperative.  ? ? ? ?UC Treatments / Results  ?Labs ?(all labs ordered are listed, but only abnormal results are displayed) ?Labs Reviewed - No data to display ? ?EKG ? ? ?Radiology ?DG Finger Little Right ? ?Result Date: 12/17/2021 ?CLINICAL DATA:  Acute RIGHT little finger injury and pain. EXAM: RIGHT LITTLE FINGER 2+V COMPARISON:  None Available. FINDINGS: There is a linear lucency within the mid aspect of the middle phalanx with somewhat sharp borders and equivocal for a nondisplaced fracture. No other fracture, subluxation or dislocation identified. Soft tissue swelling is present. IMPRESSION: 1. Equivocal nondisplaced  fracture of the middle phalanx. 2. Soft tissue swelling. No other acute bony abnormalities. 3. Consider follow-up radiographs in 7-10 days. Electronically Signed  By: Margarette Canada M.D.   On: 12/17/2021 18:46   ? ?Procedures ?Procedures (including critical care time) ? ?Medications Ordered in UC ?Medications - No data to display ? ?Initial Impression / Assessment and Plan / UC Course  ?I have reviewed the triage vital signs and the nursing notes. ? ?Pertinent labs & imaging results that were available during my care of the patient were reviewed by me and considered in my medical decision making (see chart for details). ? ?  ?Pinky x-ray today shows nondisplaced fracture of the the middle phalanx.  The skin is open directly over where the fracture is, therefore I believe this is an open fracture.  Given the type of injury and type of fracture, I recommended patient be seen and evaluated in the emergency room for further evaluation and management.  Patient is agreement to this plan and is stable to drive himself via personal vehicle at this time.  All questions answered.  Patient left urgent care heading to emergency room immediately. ?Final Clinical Impressions(s) / UC Diagnoses  ? ?Final diagnoses:  ?Crush injury  ?Open nondisplaced fracture of distal phalanx of right little finger, initial encounter  ? ? ? ?Discharge Instructions   ? ?  ?- Please go directly to the emergency room after leaving urgent care for further work-up and evaluation of your right pinky injury ? ? ? ? ?ED Prescriptions   ?None ?  ? ?PDMP not reviewed this encounter. ?  ?Eulogio Bear, NP ?12/17/21 1902 ? ?

## 2021-12-17 NOTE — Progress Notes (Signed)
Orthopedic Tech Progress Note ?Patient Details:  ?[name] [name] ?30-Aug-1950 ?[id number] ? ?Ortho Devices ?Type of Ortho Device: Finger splint ?Ortho Device/Splint Location: rue pinky splint ?Ortho Device/Splint Interventions: Ordered, Application, Adjustment ?  ?Post Interventions ?Patient Tolerated: Well ?Instructions Provided: Care of device, Adjustment of device ? ?Karolee Stamps ?12/17/2021, 10:44 PM ? ?

## 2021-12-24 ENCOUNTER — Other Ambulatory Visit: Payer: Self-pay | Admitting: Cardiology

## 2022-03-06 ENCOUNTER — Telehealth: Payer: Self-pay | Admitting: Cardiology

## 2022-03-06 NOTE — Telephone Encounter (Signed)
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 Advised patient that he had a normal test 04/2021. He should not need another one this year. If he finds out that he does need one or needs any other cardiac testing he will let us know.

## 2022-03-06 NOTE — Telephone Encounter (Signed)
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

## 2022-03-16 ENCOUNTER — Other Ambulatory Visit: Payer: Self-pay | Admitting: Cardiology

## 2022-03-16 MED ORDER — METOPROLOL SUCCINATE ER 25 MG PO TB24: 25.0000 mg | ORAL_TABLET | Freq: Every day | ORAL | 1 refills | Status: DC

## 2022-03-16 MED ORDER — ROSUVASTATIN CALCIUM 40 MG PO TABS: 20.0000 mg | ORAL_TABLET | ORAL | 1 refills | Status: DC

## 2022-04-06 ENCOUNTER — Other Ambulatory Visit: Payer: Self-pay | Admitting: Cardiology

## 2022-04-08 ENCOUNTER — Other Ambulatory Visit: Payer: Self-pay | Admitting: Cardiology

## 2022-04-24 DIAGNOSIS — H2513 Age-related nuclear cataract, bilateral: Secondary | ICD-10-CM | POA: Diagnosis not present

## 2022-04-24 DIAGNOSIS — H52203 Unspecified astigmatism, bilateral: Secondary | ICD-10-CM | POA: Diagnosis not present

## 2022-04-24 DIAGNOSIS — H5203 Hypermetropia, bilateral: Secondary | ICD-10-CM | POA: Diagnosis not present

## 2022-04-24 DIAGNOSIS — H43813 Vitreous degeneration, bilateral: Secondary | ICD-10-CM | POA: Diagnosis not present

## 2022-05-06 ENCOUNTER — Other Ambulatory Visit: Payer: Self-pay | Admitting: Cardiology

## 2022-05-08 ENCOUNTER — Other Ambulatory Visit: Payer: Self-pay | Admitting: *Deleted

## 2022-05-08 ENCOUNTER — Encounter: Payer: Self-pay | Admitting: Physician Assistant

## 2022-05-08 ENCOUNTER — Ambulatory Visit: Payer: Medicare Other | Attending: Physician Assistant | Admitting: Physician Assistant

## 2022-05-08 VITALS — BP 124/80 | HR 63 | Ht 68.0 in | Wt 200.0 lb

## 2022-05-08 DIAGNOSIS — I10 Essential (primary) hypertension: Secondary | ICD-10-CM

## 2022-05-08 DIAGNOSIS — I6523 Occlusion and stenosis of bilateral carotid arteries: Secondary | ICD-10-CM | POA: Diagnosis not present

## 2022-05-08 DIAGNOSIS — I2510 Atherosclerotic heart disease of native coronary artery without angina pectoris: Secondary | ICD-10-CM

## 2022-05-08 DIAGNOSIS — E785 Hyperlipidemia, unspecified: Secondary | ICD-10-CM | POA: Insufficient documentation

## 2022-05-08 DIAGNOSIS — R072 Precordial pain: Secondary | ICD-10-CM | POA: Insufficient documentation

## 2022-05-08 NOTE — Assessment & Plan Note (Signed)
Minimal plaque on [place] in 2020. Arrange f/u Carotid US.

## 2022-05-08 NOTE — Progress Notes (Signed)
Cardiology Office Note:    Date:  05/08/2022   ID:  [name] [name], DOB Mar 01, 1951, MRN [id number]  PCP:  Aurea Graff.Marlou Sa, [place] Prov[facility] Cardiologist:  Fransico Him, MD     Referring MD: Alroy Dust, Carlean Jews.Marlou Sa, MD   Chief Complaint:  F/u for CAD    Patient Profile: Coronary artery disease  S/p CABG in 2007 Cath 2018: 2/3 grafts patent Myoview 05/2019: Low risk Myoview 04/2021: EF 63, no ischemia or infarction; low risk Hypertension Carotid artery stenosis [place] 03/2019: Bilat ICA 1-39  Hyperlipidemia  Prior CV Studies: GATED SPECT MYO PERF W/LEXISCAN STRESS 1D 05/02/2021 The study is normal. The study is low risk. EF: 63 %. No evidence of ischemia and no evidence of previous infarction.    Myoview 06/02/2019 EF 69, no ischemia; low risk   Carotid US 03/17/2019 Bilateral ICA 1-39   Cardiac catheterization 07/18/2017 LAD ostial 70, proximal 90 LCx ostial 30 RCA ostial 40, proximal 95 SVG-distal RCA patent SVG-OM2 100 CTO LIMA-LAD patent   Exercise tolerance test 07/16/2017 ECG changes consistent with ischemia  History of Present Illness:   [name] [name] is a 71 y.o. male with the above problem list.  He was last seen in September 2021.  He returns for follow-up. He is here alone. He is doing well without chest pain, shortness of breath, syncope, leg edema.         Past Medical History:  Diagnosis Date   Bilateral carotid artery stenosis    1-39% by dopplers 03/2017   CAD (coronary artery disease), native coronary artery 08/28/2016   s/p CABG   Depression    Dr Charissa Bash   Dyslipidemia    Hypertension    Melanoma ([name]) 2010   of the scalp Dr Manuela Neptune   Current Medications: Current Meds  Medication Sig   aspirin 81 MG tablet Take 1 tablet (81 mg total) by mouth daily.   Calcium Carb-Cholecalciferol (CALCIUM 500 +D PO) Take 1 tablet by mouth daily.   ezetimibe (ZETIA) 10 MG tablet Take 1 tablet (10 mg total) by mouth daily. PLEASE KEEP  UPCOMING APPT FOR FUTURE REFILLS THANK YOU 1ST ATTEMPT   fluticasone (FLONASE) 50 MCG/ACT nasal spray Place 1 spray into both nostrils as needed for allergies or rhinitis.   loratadine (CLARITIN) 10 MG tablet Take 10 mg by mouth daily.   metoprolol succinate (TOPROL-XL) 25 MG 24 hr tablet Take 1 tablet (25 mg total) by mouth daily.   Omega-3 Fatty Acids (FISH OIL) 1000 MG CAPS Take 1 capsule by mouth 2 (two) times daily.   rosuvastatin (CRESTOR) 40 MG tablet Take 0.5 tablets (20 mg total) by mouth 3 (three) times a week.   vitamin C (ASCORBIC ACID) 500 MG tablet Take 1,000 mg by mouth daily.     Allergies:   Penicillins   Social History   Tobacco Use   Smoking status: Former    Types: Cigarettes    Quit date: 08/14/1988    Years since quitting: 33.7   Smokeless tobacco: Never  Substance Use Topics   Alcohol use: Yes    Comment: 5 per week   Drug use: No    Family Hx: The patient's family history includes Heart disease in his father.  Review of Systems  Cardiovascular:  Negative for claudication.  Gastrointestinal:  Negative for hematochezia.  Genitourinary:  Negative for hematuria.      EKGs/Labs/Other Test Reviewed:    EKG:  EKG is   ordered today.  The ekg ordered today demonstrates NSR, HR 63, no ST-TW changes, QTc 409 ms, no change from prior tracing  Recent Labs: No results found for requested labs within last 365 days.   Recent Lipid Panel Labs from PCP personally reviewed and interpreted. 05/30/2021: Creatinine 1.07, K+ 4.5, ALT 13, total cholesterol 169, triglycerides 120, HDL 49, LDL 98  Risk Assessment/Calculations/Metrics:              Physical Exam:    VS:  BP 124/80   Pulse 63   Ht 5' 8"  (1.727 m)   Wt 200 lb (90.7 kg)   SpO2 97%   BMI 30.41 kg/m     Wt Readings from Last 3 Encounters:  05/08/22 200 lb (90.7 kg)  12/17/21 190 lb (86.2 kg)  05/02/21 190 lb (86.2 kg)    Constitutional:      Appearance: Healthy appearance. Not in distress.   Neck:     Vascular: JVD normal.  Pulmonary:     Effort: Pulmonary effort is normal.     Breath sounds: No wheezing. No rales.  Cardiovascular:     Normal rate. Regular rhythm. Normal S1. Normal S2.      Murmurs: There is no murmur.  Edema:    Peripheral edema absent.  Abdominal:     Palpations: Abdomen is soft.  Skin:    General: Skin is warm and dry.  Neurological:     General: No focal deficit present.     Mental Status: Alert and oriented to person, place and time.         ASSESSMENT & PLAN:   CAD (coronary artery disease), native coronary artery S/p CABG in 2007. Cath in 2018 with 2/3 grafts patent. Myoview in 04/2021 low risk. He is doing well without angina. 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 Will arrange a f/u Myoview in 2024. Continue ASA 81 mg once daily, Zetia 10 mg once daily, Crestor 20 mg every other day, Toprol XL 25 mg once daily. F/u in 1 year.   Hyperlipidemia LDL goal <70 LDL in 10/22 was 98. He notes he increased his Crestor to 20 mg every other day since. He has been tolerating this dose well. Continue Zetia 10 mg once daily, Crestor 20 mg every other day. Arrange f/u Lipids, CMET.  If LDL remains above goal, increase to once daily.  Essential hypertension, benign The patient's blood pressure is controlled on his current regimen.  Continue current therapy.    Carotid artery stenosis Minimal plaque on [place] in 2020. Arrange f/u Carotid US.        Shared Decision Making/Informed Consent The risks [chest pain, shortness of breath, cardiac arrhythmias, dizziness, blood pressure fluctuations, myocardial infarction, stroke/transient ischemic attack, nausea, vomiting, allergic reaction, radiation exposure, metallic taste sensation and life-threatening complications (estimated to be 1 in 10,000)], benefits (risk stratification, diagnosing coronary artery disease, treatment guidance) and  alternatives of a nuclear stress test were discussed in detail with [name] [name].   Dispo:  Return in about 1 year (around 05/09/2023) for Routine Follow Up w/ Dr. Radford Pax, or Richardson Dopp, PA-C.   Medication Adjustments/Labs and Tests Ordered: Current medicines are reviewed at length with the patient today.  Concerns regarding medicines are outlined above.  Tests Ordered: Orders Placed This Encounter  Procedures   Comp Met (CMET)   Lipid panel   MYOCARDIAL PERFUSION IMAGING  EKG 12-Lead   VAS US CAROTID   Medication Changes: No orders of the defined types were placed in this encounter.  Signed, Richardson Dopp, PA-C  05/08/2022 2:06 PM    [address], [place], [place]  [address] Phone: [phone]; Fax: [phone]

## 2022-05-08 NOTE — Assessment & Plan Note (Signed)
The patient's blood pressure is controlled on his current regimen.  Continue current therapy.  

## 2022-05-08 NOTE — Assessment & Plan Note (Signed)
S/p CABG in 2007. Cath in 2018 with 2/3 grafts patent. Myoview in 04/2021 low risk. He is doing well without angina. 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 Will arrange a f/u Myoview in 2024. Continue ASA 81 mg once daily, Zetia 10 mg once daily, Crestor 20 mg every other day, Toprol XL 25 mg once daily. F/u in 1 year.

## 2022-05-08 NOTE — Assessment & Plan Note (Signed)
LDL in 10/22 was 98. He notes he increased his Crestor to 20 mg every other day since. He has been tolerating this dose well. Continue Zetia 10 mg once daily, Crestor 20 mg every other day. Arrange f/u Lipids, CMET.  If LDL remains above goal, increase to once daily.

## 2022-05-08 NOTE — Patient Instructions (Addendum)
Medication Instructions:  Your physician recommends that you continue on your current medications as directed. Please refer to the Current Medication list given to you today.  *If you need a refill on your cardiac medications before your next appointment, please call your pharmacy*   Lab Work: [place], NEEDS FASTING:  CMET & LIPID  If you have labs (blood work) drawn today and your tests are completely normal, you will receive your results only by: [place] (if you have MyChart) OR A paper copy in the mail If you have any lab test that is abnormal or we need to change your treatment, we will call you to review the results.   Testing/Procedures: Your physician has requested that you have a carotid duplex. This test is an ultrasound of the carotid arteries in your neck. It looks at blood flow through these arteries that supply the brain with blood. Allow one hour for this exam. There are no restrictions or special instructions.   Your physician has requested that you have a lexiscan myoview. For further information please visit [email or web address]. Please follow instruction sheet, BELOW:    You are scheduled for a Myocardial Perfusion Imaging Study  Please arrive 15 minutes prior to your appointment time for registration and insurance purposes.  The test will take approximately 3 to 4 hours to complete; you may bring reading material.  If someone comes with you to your appointment, they will need to remain in the main lobby due to limited space in the testing area. **If you are pregnant or breastfeeding, please notify the nuclear lab prior to your appointment**  How to prepare for your Myocardial Perfusion Test: Do not eat or drink 3 hours prior to your test, except you may have water. Do not consume products containing caffeine (regular or decaffeinated) 12 hours prior to your test. (ex: coffee, chocolate, sodas, tea). Do bring a list of your current medications  with you.  If not listed below, you may take your medications as normal. Do not take metoprolol (Lopressor, Toprol) for 24 hours prior to the test.  Bring the medication to your appointment as you may be required to take it once the test is complete. Do wear comfortable clothes (no dresses or overalls) and walking shoes, tennis shoes preferred (No heels or open toe shoes are allowed). Do NOT wear cologne, perfume, aftershave, or lotions (deodorant is allowed). If these instructions are not followed, your test will have to be rescheduled.      Follow-Up: At [facility], you and your health needs are our priority.  As part of our continuing mission to provide you with exceptional heart care, we have created designated Provider Care Teams.  These Care Teams include your primary Cardiologist (physician) and Advanced Practice Providers (APPs -  Physician Assistants and Nurse Practitioners) who all work together to provide you with the care you need, when you need it.  We recommend signing up for the patient portal called "MyChart".  Sign up information is provided on this After Visit Summary.  MyChart is used to connect with patients for Virtual Visits (Telemedicine).  Patients are able to view lab/test results, encounter notes, upcoming appointments, etc.  Non-urgent messages can be sent to your provider as well.   To learn more about what you can do with MyChart, go to [email or web address].    Your next appointment:   1 year(s)  The format for your next appointment:   In Person  Provider:   Tressia Miners  Radford Pax, MD  or Richardson Dopp, PA-C         Other Instructions   Important Information About Sugar

## 2022-05-26 ENCOUNTER — Other Ambulatory Visit: Payer: Self-pay | Admitting: Cardiology

## 2022-06-05 ENCOUNTER — Ambulatory Visit (HOSPITAL_COMMUNITY): Payer: Medicare Other

## 2022-06-19 ENCOUNTER — Ambulatory Visit (HOSPITAL_COMMUNITY)
Admission: RE | Admit: 2022-06-19 | Discharge: 2022-06-19 | Disposition: A | Discharge status: Home / Self Care | Payer: Medicare Other | Source: Ambulatory Visit | Attending: Physician Assistant | Admitting: Physician Assistant

## 2022-06-19 DIAGNOSIS — I10 Essential (primary) hypertension: Secondary | ICD-10-CM | POA: Insufficient documentation

## 2022-06-19 DIAGNOSIS — I2510 Atherosclerotic heart disease of native coronary artery without angina pectoris: Secondary | ICD-10-CM | POA: Insufficient documentation

## 2022-06-19 DIAGNOSIS — I6523 Occlusion and stenosis of bilateral carotid arteries: Secondary | ICD-10-CM | POA: Diagnosis not present

## 2022-06-20 LAB — COMPREHENSIVE METABOLIC PANEL
ALT: 17 IU/L (ref 0–44)
AST: 19 IU/L (ref 0–40)
Albumin/Globulin Ratio: 1.7 (ref 1.2–2.2)
Albumin: 4.3 g/dL (ref 3.8–4.8)
Alkaline Phosphatase: 47 IU/L (ref 44–121)
BUN/Creatinine Ratio: 13 (ref 10–24)
BUN: 15 mg/dL (ref 8–27)
Bilirubin Total: 0.5 mg/dL (ref 0.0–1.2)
CO2: 19 mmol/L — ABNORMAL LOW (ref 20–29)
Calcium: 9.7 mg/dL (ref 8.6–10.2)
Chloride: 108 mmol/L — ABNORMAL HIGH (ref 96–106)
Creatinine, Ser: 1.16 mg/dL (ref 0.76–1.27)
Globulin, Total: 2.5 g/dL (ref 1.5–4.5)
Glucose: 93 mg/dL (ref 70–99)
Potassium: 4.8 mmol/L (ref 3.5–5.2)
Sodium: 144 mmol/L (ref 134–144)
Total Protein: 6.8 g/dL (ref 6.0–8.5)
eGFR: 67 mL/min/{1.73_m2} (ref >59)

## 2022-06-20 LAB — LIPID PANEL
Chol/HDL Ratio: 3.1 ratio (ref 0.0–5.0)
Cholesterol, Total: 145 mg/dL (ref 100–199)
HDL: 47 mg/dL (ref >39)
LDL Chol Calc (NIH): 82 mg/dL (ref 0–99)
Triglycerides: 84 mg/dL (ref 0–149)
VLDL Cholesterol Cal: 16 mg/dL (ref 5–40)

## 2022-06-23 ENCOUNTER — Telehealth: Payer: Self-pay | Admitting: *Deleted

## 2022-06-23 DIAGNOSIS — E785 Hyperlipidemia, unspecified: Secondary | ICD-10-CM

## 2022-06-23 MED ORDER — ROSUVASTATIN CALCIUM 20 MG PO TABS: 20.0000 mg | ORAL_TABLET | Freq: Every day | ORAL | 3 refills | Status: DC

## 2022-06-23 NOTE — Telephone Encounter (Signed)
-----   Message from Liliane Shi, [place] sent at 06/20/2022  1:23 PM EST ----- LDL is still above goal. Goal is < 70 (ideally < 55).  PLAN: -Recommend increasing Rosuvastatin to 20 mg EVERY day (currently taking every other day). -Repeat fasting Lipids, LFTs in 3 mos -If he has side effects to this, notify us so that we can refer to PharmD clinic for PCSK9 inhib. Richardson Dopp, PA-C    06/20/2022 1:22 PM

## 2022-07-03 ENCOUNTER — Other Ambulatory Visit: Payer: Self-pay | Admitting: Family Medicine

## 2022-07-03 DIAGNOSIS — Z0000 Encounter for general adult medical examination without abnormal findings: Secondary | ICD-10-CM | POA: Diagnosis not present

## 2022-07-03 DIAGNOSIS — I6529 Occlusion and stenosis of unspecified carotid artery: Secondary | ICD-10-CM | POA: Diagnosis not present

## 2022-07-03 DIAGNOSIS — K219 Gastro-esophageal reflux disease without esophagitis: Secondary | ICD-10-CM | POA: Diagnosis not present

## 2022-07-03 DIAGNOSIS — Z23 Encounter for immunization: Secondary | ICD-10-CM | POA: Diagnosis not present

## 2022-07-03 DIAGNOSIS — Z8659 Personal history of other mental and behavioral disorders: Secondary | ICD-10-CM | POA: Diagnosis not present

## 2022-07-03 DIAGNOSIS — Z136 Encounter for screening for cardiovascular disorders: Secondary | ICD-10-CM | POA: Diagnosis not present

## 2022-07-03 DIAGNOSIS — M25552 Pain in left hip: Secondary | ICD-10-CM | POA: Diagnosis not present

## 2022-07-03 DIAGNOSIS — E7800 Pure hypercholesterolemia, unspecified: Secondary | ICD-10-CM | POA: Diagnosis not present

## 2022-07-03 DIAGNOSIS — Z951 Presence of aortocoronary bypass graft: Secondary | ICD-10-CM | POA: Diagnosis not present

## 2022-07-03 DIAGNOSIS — I2510 Atherosclerotic heart disease of native coronary artery without angina pectoris: Secondary | ICD-10-CM | POA: Diagnosis not present

## 2022-07-03 DIAGNOSIS — Z1211 Encounter for screening for malignant neoplasm of colon: Secondary | ICD-10-CM | POA: Diagnosis not present

## 2022-07-17 ENCOUNTER — Ambulatory Visit: Payer: Medicare Other

## 2022-07-21 DIAGNOSIS — Z1211 Encounter for screening for malignant neoplasm of colon: Secondary | ICD-10-CM | POA: Diagnosis not present

## 2022-07-24 ENCOUNTER — Ambulatory Visit: Payer: Medicare Other

## 2022-08-28 ENCOUNTER — Other Ambulatory Visit: Payer: Self-pay | Admitting: Cardiology

## 2022-09-22 ENCOUNTER — Other Ambulatory Visit: Payer: Medicare Other

## 2022-10-09 DIAGNOSIS — M25552 Pain in left hip: Secondary | ICD-10-CM | POA: Diagnosis not present

## 2022-11-06 DIAGNOSIS — M1611 Unilateral primary osteoarthritis, right hip: Secondary | ICD-10-CM | POA: Diagnosis not present

## 2022-11-06 DIAGNOSIS — M1612 Unilateral primary osteoarthritis, left hip: Secondary | ICD-10-CM | POA: Diagnosis not present

## 2022-11-20 DIAGNOSIS — M1612 Unilateral primary osteoarthritis, left hip: Secondary | ICD-10-CM | POA: Diagnosis not present

## 2022-11-20 DIAGNOSIS — M1611 Unilateral primary osteoarthritis, right hip: Secondary | ICD-10-CM | POA: Diagnosis not present

## 2022-11-24 DIAGNOSIS — M1611 Unilateral primary osteoarthritis, right hip: Secondary | ICD-10-CM | POA: Diagnosis not present

## 2022-11-24 DIAGNOSIS — M1612 Unilateral primary osteoarthritis, left hip: Secondary | ICD-10-CM | POA: Diagnosis not present

## 2022-12-04 DIAGNOSIS — M1612 Unilateral primary osteoarthritis, left hip: Secondary | ICD-10-CM | POA: Diagnosis not present

## 2022-12-04 DIAGNOSIS — M1611 Unilateral primary osteoarthritis, right hip: Secondary | ICD-10-CM | POA: Diagnosis not present

## 2023-04-20 ENCOUNTER — Telehealth (HOSPITAL_COMMUNITY): Payer: Self-pay | Admitting: *Deleted

## 2023-04-20 NOTE — Telephone Encounter (Signed)

## 2023-04-23 ENCOUNTER — Telehealth: Payer: Self-pay | Admitting: Cardiology

## 2023-04-23 ENCOUNTER — Ambulatory Visit (HOSPITAL_COMMUNITY): Payer: Medicare Other | Attending: Internal Medicine

## 2023-04-23 ENCOUNTER — Encounter: Payer: Self-pay | Admitting: Physician Assistant

## 2023-04-23 DIAGNOSIS — I10 Essential (primary) hypertension: Secondary | ICD-10-CM | POA: Insufficient documentation

## 2023-04-23 DIAGNOSIS — E785 Hyperlipidemia, unspecified: Secondary | ICD-10-CM | POA: Insufficient documentation

## 2023-04-23 DIAGNOSIS — R072 Precordial pain: Secondary | ICD-10-CM | POA: Diagnosis present

## 2023-04-23 DIAGNOSIS — I2510 Atherosclerotic heart disease of native coronary artery without angina pectoris: Secondary | ICD-10-CM | POA: Insufficient documentation

## 2023-04-23 LAB — MYOCARDIAL PERFUSION IMAGING
LV dias vol: 30 mL (ref 62–150)
LV sys vol: 76 mL
Nuc Stress EF: 61 %
Peak HR: 76 {beats}/min
Rest HR: 48 {beats}/min
Rest Nuclear Isotope Dose: 10.3 mCi
SDS: 4
SRS: 3
SSS: 7
ST Depression (mm): 0 mm
Stress Nuclear Isotope Dose: 32.6 mCi
TID: 1.05

## 2023-04-23 MED ORDER — TECHNETIUM TC 99M TETROFOSMIN IV KIT
32.6000 | PACK | Freq: Once | INTRAVENOUS | Status: AC | PRN
  Administered 2023-04-23: 32.6 via INTRAVENOUS

## 2023-04-23 MED ORDER — EZETIMIBE 10 MG PO TABS: 10.0000 mg | ORAL_TABLET | Freq: Every day | ORAL | 0 refills | Status: DC

## 2023-04-23 MED ORDER — REGADENOSON 0.4 MG/5ML IV SOLN
0.4000 mg | Freq: Once | INTRAVENOUS | Status: AC
  Administered 2023-04-23: 0.4 mg via INTRAVENOUS

## 2023-04-23 MED ORDER — TECHNETIUM TC 99M TETROFOSMIN IV KIT
10.3000 | PACK | Freq: Once | INTRAVENOUS | Status: AC | PRN
  Administered 2023-04-23: 10.3 via INTRAVENOUS

## 2023-04-23 NOTE — Telephone Encounter (Signed)
Pt's medication was sent to pt's pharmacy as requested. Confirmation received.  °

## 2023-04-23 NOTE — Telephone Encounter (Signed)
New message    *STAT* If patient is at the pharmacy, call can be transferred to refill team.   1. Which medications need to be refilled? (please list name of each medication and dose if known)  Zetia 10mg    2. Would you like to learn more about the convenience, safety, & potential cost savings by using the [facility]?  Not at this time   3. Are you open to using the [facility] (Type [facility]. ). Not at this time   4. Which pharmacy/location (including street and city if local pharmacy) is medication to be sent to [facility] at [address]   5. Do they need a 30 day or 90 day supply?  90 day supply

## 2023-04-25 DIAGNOSIS — H5203 Hypermetropia, bilateral: Secondary | ICD-10-CM | POA: Diagnosis not present

## 2023-04-25 DIAGNOSIS — H2513 Age-related nuclear cataract, bilateral: Secondary | ICD-10-CM | POA: Diagnosis not present

## 2023-04-25 DIAGNOSIS — H52203 Unspecified astigmatism, bilateral: Secondary | ICD-10-CM | POA: Diagnosis not present

## 2023-05-21 ENCOUNTER — Other Ambulatory Visit: Payer: Self-pay | Admitting: Cardiology

## 2023-06-18 ENCOUNTER — Other Ambulatory Visit: Payer: Self-pay | Admitting: Cardiology

## 2023-06-19 NOTE — Progress Notes (Unsigned)
Cardiology Office Note:    Date:  06/20/2023   ID:  [name] [name], DOB [birth year], MRN [id number]  PCP:  Asencion Gowda.August Saucer, MD (Inactive)  [facility] Providers Cardiologist:  Armanda Magic, MD     Referring MD: Clovis Riley, Elbert Ewings.August Saucer, MD   Chief Complaint:  Coronary Artery Disease, Hypertension, and Hyperlipidemia    Patient Profile: Coronary artery disease  S/p CABG in 2007 Cath 2018: 2/3 grafts patent Myoview 05/2019: Low risk Myoview 04/2021: EF 63, no ischemia or infarction; low risk Hypertension Carotid artery stenosis [place] 03/2019: Bilat ICA 1-39  Hyperlipidemia  Prior CV Studies: GATED SPECT MYO PERF W/LEXISCAN STRESS 1D 05/02/2021 The study is normal. The study is low risk. EF: 63 %. No evidence of ischemia and no evidence of previous infarction.    Myoview 06/02/2019 EF 69, no ischemia; low risk   Carotid US 03/17/2019 Bilateral ICA 1-39   Cardiac catheterization 07/18/2017 LAD ostial 70, proximal 90 LCx ostial 30 RCA ostial 40, proximal 95 SVG-distal RCA patent SVG-OM2 100 CTO LIMA-LAD patent   Exercise tolerance test 07/16/2017 ECG changes consistent with ischemia  History of Present Illness:   [name] [name] is a 72 y.o. male with the above problem list.  He is here today for followup and is doing well.  He denies any chest pain or pressure, SOB, DOE, PND, orthopnea, LE edema, dizziness, palpitations or syncope. He is compliant with his meds and is tolerating meds with no SE.    In the last 2 months he has been much more active with his job walking a lot.     Past Medical History:  Diagnosis Date   Bilateral carotid artery stenosis    1-39% by dopplers 03/2017   CAD (coronary artery disease), native coronary artery 08/28/2016   s/p CABG // Myoview 04/2023: EF 61, no ischemia.   Depression    Dr Electa Sniff   Dyslipidemia    Hypertension    Melanoma (HCC) 2010   of the scalp Dr Gerlene Fee Alean Rinne   Current Medications: Current Meds  Medication Sig    Calcium Carb-Cholecalciferol (CALCIUM 500 +D PO) Take 1 tablet by mouth daily.   Coenzyme Q10 (Q-SORB CO Q-10) 100 MG capsule 1 capsule with a meal Orally Once a day   ezetimibe (ZETIA) 10 MG tablet TAKE 1 TABLET BY MOUTH EVERY DAY   famotidine (PEPCID) 20 MG tablet 1 tablet Orally Twice a day   fluticasone (FLONASE) 50 MCG/ACT nasal spray Place 1 spray into both nostrils as needed for allergies or rhinitis.   ibuprofen (ADVIL) 200 MG tablet 4 tablets with food or milk as needed Orally Three times a day   loratadine (CLARITIN) 10 MG tablet Take 10 mg by mouth daily.   metoprolol succinate (TOPROL-XL) 25 MG 24 hr tablet TAKE 1 TABLET (25 MG TOTAL) BY MOUTH DAILY.   Omega-3 Fatty Acids (FISH OIL) 1000 MG CAPS Take 1 capsule by mouth 2 (two) times daily.   rosuvastatin (CRESTOR) 20 MG tablet Take 1 tablet (20 mg total) by mouth daily.   vitamin C (ASCORBIC ACID) 500 MG tablet Take 1,000 mg by mouth daily.     Allergies:   Penicillins   Social History   Tobacco Use   Smoking status: Former    Current packs/day: 0.00    Types: Cigarettes    Quit date: 08/14/1988    Years since quitting: 34.8   Smokeless tobacco: Never  Substance Use Topics   Alcohol use: Yes    Comment:  5 per week   Drug use: No    Family Hx: The patient's family history includes Heart disease in his father.  Review of Systems  Cardiovascular:  Negative for claudication.  Gastrointestinal:  Negative for hematochezia.  Genitourinary:  Negative for hematuria.      EKGs/Labs/Other Test Reviewed:    EKG Interpretation Date/Time:  Wednesday June 20 2023 08:34:53 EST Ventricular Rate:  54 PR Interval:  162 QRS Duration:  78 QT Interval:  428 QTC Calculation: 405 R Axis:   15  Text Interpretation: Sinus bradycardia When compared with ECG of 18-Jul-2017 13:18, No significant change was found Confirmed by Armanda Magic ([id number]) on 06/20/2023 8:40:13 AM   Recent Labs: No results found for requested labs within  last 365 days.   Recent Lipid Panel Labs from PCP personally reviewed and interpreted. 05/30/2021: Creatinine 1.07, K+ 4.5, ALT 13, total cholesterol 169, triglycerides 120, HDL 49, LDL 98   Physical Exam:    VS:  BP (!) 146/76   Pulse (!) 54   Ht 5\' 7"  (1.702 m)   Wt 193 lb 9.6 oz (87.8 kg)   SpO2 91%   BMI 30.32 kg/m     Wt Readings from Last 3 Encounters:  06/20/23 193 lb 9.6 oz (87.8 kg)  04/23/23 200 lb (90.7 kg)  05/08/22 200 lb (90.7 kg)  GEN: Well nourished, well developed in no acute distress HEENT: Normal NECK: No JVD; No carotid bruits LYMPHATICS: No lymphadenopathy CARDIAC:RRR, no murmurs, rubs, gallops RESPIRATORY:  Clear to auscultation without rales, wheezing or rhonchi  ABDOMEN: Soft, non-tender, non-distended MUSCULOSKELETAL:  No edema; No deformity  SKIN: Warm and dry NEUROLOGIC:  Alert and oriented x 3 PSYCHIATRIC:  Normal affect       ASSESSMENT & PLAN:    CAD (coronary artery disease), native coronary artery -S/p CABG in 2007.  -Cath in 2018 with 2/3 grafts patent.  -Myoview in 04/2021 low risk.  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  Steffanie Dunn 9/24 showed no ischemia -continue prescription drug management with ASA 81mg  daily, Toprol XL 25mg  daily, Zetia 10mg  daily, Crestor 20mg  daily with PRN refills  Hyperlipidemia LDL goal <70 -LDL goal < 70 -continue prescription drug management with Crestor 20mg  daily (dose not tolerate doses higher than this) and Zetia 10mg  daily with PRN refills -check FLP and ALT   Essential hypertension, benign -BP borderline controlled on exam today>>he just had coffee which he says increases his BP and he also has white coat HTN -at home his BP normally runs around 130/63mmHg -continue prescription drug management with Toprol XL 25mg  daily with PRN refills   Carotid artery stenosis -1-39% bilateral carotid stenosis by dopplers 06/2022 -continue statin and ASA 81mg  daily -repeat dopplers  06/2024        Shared Decision Making/Informed Consent The risks [chest pain, shortness of breath, cardiac arrhythmias, dizziness, blood pressure fluctuations, myocardial infarction, stroke/transient ischemic attack, nausea, vomiting, allergic reaction, radiation exposure, metallic taste sensation and life-threatening complications (estimated to be 1 in 10,000)], benefits (risk stratification, diagnosing coronary artery disease, treatment guidance) and alternatives of a nuclear stress test were discussed in detail with [name] [name] and he agrees to proceed.   Dispo:  No follow-ups on file.   Medication Adjustments/Labs and Tests Ordered: Current medicines are reviewed at length with the patient today.  Concerns regarding medicines are outlined above.  Tests Ordered: Orders Placed This Encounter  Procedures   EKG 12-Lead   Medication Changes:  No orders of the defined types were placed in this encounter.  Signed, Armanda Magic, MD  06/20/2023 8:58 AM    [facility] [address] [place], [place], [place]  [address] Phone: [phone]; Fax: [phone]

## 2023-06-20 ENCOUNTER — Ambulatory Visit: Payer: Medicare Other | Attending: Cardiology | Admitting: Cardiology

## 2023-06-20 ENCOUNTER — Encounter: Payer: Self-pay | Admitting: Cardiology

## 2023-06-20 VITALS — BP 146/76 | HR 54 | Ht 67.0 in | Wt 193.6 lb

## 2023-06-20 DIAGNOSIS — I6523 Occlusion and stenosis of bilateral carotid arteries: Secondary | ICD-10-CM | POA: Diagnosis not present

## 2023-06-20 DIAGNOSIS — I10 Essential (primary) hypertension: Secondary | ICD-10-CM | POA: Insufficient documentation

## 2023-06-20 DIAGNOSIS — I2510 Atherosclerotic heart disease of native coronary artery without angina pectoris: Secondary | ICD-10-CM | POA: Diagnosis not present

## 2023-06-20 DIAGNOSIS — Z79899 Other long term (current) drug therapy: Secondary | ICD-10-CM | POA: Diagnosis not present

## 2023-06-20 DIAGNOSIS — E785 Hyperlipidemia, unspecified: Secondary | ICD-10-CM | POA: Diagnosis not present

## 2023-06-20 NOTE — Addendum Note (Signed)
Addended by: Luellen Pucker on: 06/20/2023 09:13 AM   Modules accepted: Orders

## 2023-06-20 NOTE — Patient Instructions (Addendum)
Medication Instructions:  Your physician recommends that you continue on your current medications as directed. Please refer to the Current Medication list given to you today.  *If you need a refill on your cardiac medications before your next appointment, please call your pharmacy*   Lab Work: Please make an appointment to have a FASTING lipid panel and an ALT drawn in our lab.  If you have labs (blood work) drawn today and your tests are completely normal, you will receive your results only by: MyChart Message (if you have MyChart) OR A paper copy in the mail If you have any lab test that is abnormal or we need to change your treatment, we will call you to review the results.   Testing/Procedures: Your physician has requested that you have a carotid duplex in November 2025. This test is an ultrasound of the carotid arteries in your neck. It looks at blood flow through these arteries that supply the brain with blood. Allow one hour for this exam. There are no restrictions or special instructions.    Follow-Up:     Your next appointment:   1 year(s)  Provider:   Armanda Magic, MD

## 2023-06-20 NOTE — Addendum Note (Signed)
Addended by: Luellen Pucker on: 06/20/2023 09:19 AM   Modules accepted: Orders

## 2023-06-26 ENCOUNTER — Other Ambulatory Visit: Payer: Self-pay | Admitting: Physician Assistant

## 2023-07-11 ENCOUNTER — Other Ambulatory Visit: Payer: Self-pay | Admitting: Cardiology

## 2023-07-22 ENCOUNTER — Encounter (HOSPITAL_COMMUNITY): Payer: Self-pay | Admitting: *Deleted

## 2023-07-22 ENCOUNTER — Emergency Department (HOSPITAL_COMMUNITY): Payer: Medicare Other

## 2023-07-22 ENCOUNTER — Other Ambulatory Visit: Payer: Self-pay

## 2023-07-22 ENCOUNTER — Emergency Department (HOSPITAL_COMMUNITY)
Admission: EM | Admit: 2023-07-22 | Discharge: 2023-07-23 | Disposition: A | Discharge status: Home / Self Care | Payer: Medicare Other | Attending: Emergency Medicine | Admitting: Emergency Medicine

## 2023-07-22 DIAGNOSIS — I10 Essential (primary) hypertension: Secondary | ICD-10-CM | POA: Insufficient documentation

## 2023-07-22 DIAGNOSIS — K5730 Diverticulosis of large intestine without perforation or abscess without bleeding: Secondary | ICD-10-CM | POA: Diagnosis not present

## 2023-07-22 DIAGNOSIS — Z79899 Other long term (current) drug therapy: Secondary | ICD-10-CM | POA: Diagnosis not present

## 2023-07-22 DIAGNOSIS — Z951 Presence of aortocoronary bypass graft: Secondary | ICD-10-CM | POA: Insufficient documentation

## 2023-07-22 DIAGNOSIS — J929 Pleural plaque without asbestos: Secondary | ICD-10-CM | POA: Diagnosis not present

## 2023-07-22 DIAGNOSIS — R079 Chest pain, unspecified: Secondary | ICD-10-CM | POA: Diagnosis not present

## 2023-07-22 DIAGNOSIS — I2510 Atherosclerotic heart disease of native coronary artery without angina pectoris: Secondary | ICD-10-CM | POA: Insufficient documentation

## 2023-07-22 DIAGNOSIS — K8020 Calculus of gallbladder without cholecystitis without obstruction: Secondary | ICD-10-CM | POA: Insufficient documentation

## 2023-07-22 DIAGNOSIS — R0789 Other chest pain: Secondary | ICD-10-CM

## 2023-07-22 DIAGNOSIS — R1084 Generalized abdominal pain: Secondary | ICD-10-CM | POA: Diagnosis not present

## 2023-07-22 DIAGNOSIS — M545 Low back pain, unspecified: Secondary | ICD-10-CM | POA: Diagnosis not present

## 2023-07-22 DIAGNOSIS — R109 Unspecified abdominal pain: Secondary | ICD-10-CM | POA: Diagnosis not present

## 2023-07-22 LAB — HEPATIC FUNCTION PANEL
ALT: 17 U/L (ref 0–44)
AST: 23 U/L (ref 15–41)
Albumin: 3.7 g/dL (ref 3.5–5.0)
Alkaline Phosphatase: 43 U/L (ref 38–126)
Bilirubin, Direct: 0.2 mg/dL (ref 0.0–0.2)
Indirect Bilirubin: 0.6 mg/dL (ref 0.3–0.9)
Total Bilirubin: 0.8 mg/dL (ref <1.2)
Total Protein: 6.9 g/dL (ref 6.5–8.1)

## 2023-07-22 LAB — BASIC METABOLIC PANEL
Anion gap: 11 (ref 5–15)
BUN: 11 mg/dL (ref 8–23)
CO2: 20 mmol/L — ABNORMAL LOW (ref 22–32)
Calcium: 9.5 mg/dL (ref 8.9–10.3)
Chloride: 108 mmol/L (ref 98–111)
Creatinine, Ser: 1.05 mg/dL (ref 0.61–1.24)
GFR, Estimated: >60 mL/min (ref >60)
Glucose, Bld: 130 mg/dL — ABNORMAL HIGH (ref 70–99)
Potassium: 3.9 mmol/L (ref 3.5–5.1)
Sodium: 139 mmol/L (ref 135–145)

## 2023-07-22 LAB — CBC
HCT: 41.9 % (ref 39.0–52.0)
Hemoglobin: 14.1 g/dL (ref 13.0–17.0)
MCH: 31.5 pg (ref 26.0–34.0)
MCHC: 33.7 g/dL (ref 30.0–36.0)
MCV: 93.7 fL (ref 80.0–100.0)
Platelets: 269 10*3/uL (ref 150–400)
RBC: 4.47 MIL/uL (ref 4.22–5.81)
RDW: 13.5 % (ref 11.5–15.5)
WBC: 10 10*3/uL (ref 4.0–10.5)
nRBC: 0 % (ref 0.0–0.2)

## 2023-07-22 LAB — TROPONIN I (HIGH SENSITIVITY): Troponin I (High Sensitivity): 4 ng/L (ref <18)

## 2023-07-22 LAB — LIPASE, BLOOD: Lipase: 36 U/L (ref 11–51)

## 2023-07-22 MED ORDER — IOHEXOL 350 MG/ML SOLN
100.0000 mL | Freq: Once | INTRAVENOUS | Status: AC | PRN
  Administered 2023-07-22: 100 mL via INTRAVENOUS

## 2023-07-22 NOTE — ED Triage Notes (Signed)
The pt started having chest pain then abd pain and in his lumbar spine  approx 4 hours ago  no nausea or voomitiing

## 2023-07-22 NOTE — ED Provider Notes (Signed)
[facility]PART[facility] AT [facility] Provider Note   CSN: [id number] Arrival date & time: 07/22/23  1930     History {Add pertinent medical, surgical, social history, OB history to HPI:1} Chief Complaint  Patient presents with   Abdominal Pain   Chest Pain    [name] [name] is a 72 y.o. male.  72 year old male with a past medical history of CAD status post CABG, HTN, HLD, GERD presents for concerns of abdominal pain, back pain, and chest pain.  Onset of symptoms was sudden in nature.  Patient developed abdominal pain first.  It was located in his bilateral lower quadrants.  He describes it as a pressure sensation.  He then developed midthoracic back pain followed by chest pain.  He denies associated shortness of breath, lightheadedness, headache, change in vision, nausea, vomiting, or diaphoresis.  He has not had any numbness, weakness associated with symptoms.  He has had a recent cardiac stress test in September, which was normal.  Patient states that he urinated after arriving here.  His pain has now resolved.  The history is provided by the patient.       Home Medications Prior to Admission medications   Medication Sig Start Date End Date Taking? Authorizing Provider  Calcium Carb-Cholecalciferol (CALCIUM 500 +D PO) Take 1 tablet by mouth daily.    [provider]  Coenzyme Q10 (Q-SORB CO Q-10) 100 MG capsule 1 capsule with a meal Orally Once a day    [provider]  ezetimibe (ZETIA) 10 MG tablet TAKE 1 TABLET BY MOUTH EVERY DAY 07/11/23   Quintella Reichert, MD  famotidine (PEPCID) 20 MG tablet 1 tablet Orally Twice a day    [provider]  fluticasone (FLONASE) 50 MCG/ACT nasal spray Place 1 spray into both nostrils as needed for allergies or rhinitis.    [provider]  ibuprofen (ADVIL) 200 MG tablet 4 tablets with food or milk as needed Orally Three times a day    [provider]  loratadine (CLARITIN) 10 MG  tablet Take 10 mg by mouth daily.    [provider]  metoprolol succinate (TOPROL-XL) 25 MG 24 hr tablet TAKE 1 TABLET (25 MG TOTAL) BY MOUTH DAILY. 08/28/22   Quintella Reichert, MD  Omega-3 Fatty Acids (FISH OIL) 1000 MG CAPS Take 1 capsule by mouth 2 (two) times daily.    [provider]  rosuvastatin (CRESTOR) 20 MG tablet TAKE 1 TABLET BY MOUTH EVERY DAY 06/27/23   Tereso Newcomer T, PA-C  vitamin C (ASCORBIC ACID) 500 MG tablet Take 1,000 mg by mouth daily.     [provider]      Allergies    Penicillins    Review of Systems   As noted in HPI  Physical Exam Updated Vital Signs BP (!) 181/89 (BP Location: Right Arm)   Pulse 61   Temp 97.8 F (36.6 C) (Oral)   Resp 17   Ht 5\' 7"  (1.702 m)   Wt 87.8 kg   SpO2 97%   BMI 30.32 kg/m  Physical Exam Vitals reviewed.  Constitutional:      General: He is not in acute distress.    Appearance: Normal appearance. He is not ill-appearing, toxic-appearing or diaphoretic.  HENT:     Head: Normocephalic and atraumatic.     Nose: Nose normal.     Mouth/Throat:     Mouth: Mucous membranes are moist.  Eyes:     Extraocular Movements:  Extraocular movements intact.     Conjunctiva/sclera: Conjunctivae normal.     Pupils: Pupils are equal, round, and reactive to light.  Cardiovascular:     Rate and Rhythm: Normal rate and regular rhythm.     Pulses: Normal pulses.          Radial pulses are 2+ on the right side and 2+ on the left side.       Dorsalis pedis pulses are 2+ on the right side and 2+ on the left side.     Heart sounds: Normal heart sounds. No murmur heard.    No friction rub. No gallop.  Pulmonary:     Effort: Pulmonary effort is normal. No respiratory distress.     Breath sounds: Normal breath sounds. No wheezing, rhonchi or rales.  Abdominal:     General: There is no distension.     Palpations: Abdomen is soft.     Tenderness: There is no abdominal tenderness. There is no guarding or rebound.   Musculoskeletal:     Right lower leg: No edema.     Left lower leg: No edema.  Skin:    General: Skin is warm and dry.     Coloration: Skin is not jaundiced or pale.  Neurological:     Mental Status: He is alert.     Cranial Nerves: Cranial nerves 2-12 are intact. No cranial nerve deficit, dysarthria or facial asymmetry.     Sensory: Sensation is intact. No sensory deficit.     Motor: Motor function is intact. No weakness, tremor, abnormal muscle tone or pronator drift.     Coordination: Coordination is intact. Finger-Nose-Finger Test and Heel to Nuevo Test normal.     ED Results / Procedures / Treatments   Labs (all labs ordered are listed, but only abnormal results are displayed) Labs Reviewed - No data to display  EKG None  Radiology No results found.  Procedures Procedures  {Document cardiac monitor, telemetry assessment procedure when appropriate:1}  Medications Ordered in ED Medications - No data to display  ED Course/ Medical Decision Making/ A&P Clinical Course as of 07/22/23 2233  Sun Jul 22, 2023  2230 DG Chest 2 View I independently interpreted this image.  No mediastinal widening or cardiomegaly.  No focal opacities or pneumothorax noted. [JR]  2232 EKG 12-Lead Sinus rhythm.  Rate of 63.  Normal intervals.  No axis deviation.  Unspecific T wave abnormality in II, III.  No ST segment changes.  When compared to prior ECG, no significant changes noted [JR]    Clinical Course User Index [JR] Rolla Flatten, MD   {   Click here for ABCD2, HEART and other calculatorsREFRESH Note before signing :1}                              Medical Decision Making Amount and/or Complexity of Data Reviewed Labs: ordered. Radiology: ordered. Decision-making details documented in ED Course. ECG/medicine tests:  Decision-making details documented in ED Course.   72 year old male presents here for abdominal pain, back pain, and chest pain that was sudden onset and is now  resolved.  Patient was noted to be slightly bradycardic and hypertensive on presentation.  On exam, he is nontoxic-appearing.  He is in no acute distress.  Cardiopulmonary exam unremarkable.  He has 2+ radial and DP pulses.  He has no focal neurologic deficits.  Abdomen is soft, nontender, nondistended.  Differential includes ACS, aortic dissection, pneumothorax,  arrhythmia, electrolyte abnormality, pancreatitis, cholecystitis, bowel obstruction, bowel perforation, peptic ulcer disease. Will get screening labs with BMP, CBC, hepatic function panel, lipase, troponins.  Will also obtain chest x-ray to evaluate for pneumothorax.  Will get CT angio chest/abdomen/pelvis for dissection.  I independently reviewed the patient's chest x-ray which is unremarkable.  I interpreted the patient's ECG, which is nonischemic.  Patient's presentation is most consistent with acute presentation with potential threat to life or bodily function.   {Document critical care time when appropriate:1} {Document review of labs and clinical decision tools ie heart score, Chads2Vasc2 etc:1}  {Document your independent review of radiology images, and any outside records:1} {Document your discussion with family members, caretakers, and with consultants:1} {Document social determinants of health affecting pt's care:1} {Document your decision making why or why not admission, treatments were needed:1} Final Clinical Impression(s) / ED Diagnoses Final diagnoses:  None    Rx / DC Orders ED Discharge Orders     None

## 2023-07-23 LAB — TROPONIN I (HIGH SENSITIVITY): Troponin I (High Sensitivity): 5 ng/L (ref <18)

## 2023-07-23 NOTE — ED Notes (Signed)
 This RN reviewed discharge instructions with patient. He verbalized understanding and denied any further questions. PT well appearing upon discharge and reports no pain. Pt ambulated with stable gait to exit. Pt endorses ride home.

## 2023-07-23 NOTE — Discharge Instructions (Addendum)
Your CT scan showed the following findings:  Cholelithiasis without complicating factors.    Bilateral nonobstructing stones as described.    Diverticulosis without diverticulitis.    Suspect her pain is likely due to the gallstone.  A referral to general surgery was placed.  Today discussed treatment options.  If they have not called you in 2 to 3 days, please call the clinic phone number listed in your discharge for work to schedule an appointment.  Please make a follow-up appoint with your primary care within the next 1 week.  Please return to the emergency department if you develop any chest pain, shortness of breath, return of your abdominal pain, or severe back pain.

## 2023-09-04 ENCOUNTER — Other Ambulatory Visit: Payer: Self-pay | Admitting: Cardiology

## 2023-12-11 DIAGNOSIS — E785 Hyperlipidemia, unspecified: Secondary | ICD-10-CM | POA: Diagnosis not present

## 2023-12-11 DIAGNOSIS — Z79899 Other long term (current) drug therapy: Secondary | ICD-10-CM | POA: Diagnosis not present

## 2024-01-11 DIAGNOSIS — I6529 Occlusion and stenosis of unspecified carotid artery: Secondary | ICD-10-CM | POA: Diagnosis not present

## 2024-01-11 DIAGNOSIS — M25552 Pain in left hip: Secondary | ICD-10-CM | POA: Diagnosis not present

## 2024-01-11 DIAGNOSIS — I2510 Atherosclerotic heart disease of native coronary artery without angina pectoris: Secondary | ICD-10-CM | POA: Diagnosis not present

## 2024-01-11 DIAGNOSIS — N529 Male erectile dysfunction, unspecified: Secondary | ICD-10-CM | POA: Diagnosis not present

## 2024-01-11 DIAGNOSIS — E7800 Pure hypercholesterolemia, unspecified: Secondary | ICD-10-CM | POA: Diagnosis not present

## 2024-01-11 DIAGNOSIS — J302 Other seasonal allergic rhinitis: Secondary | ICD-10-CM | POA: Diagnosis not present

## 2024-01-11 DIAGNOSIS — K219 Gastro-esophageal reflux disease without esophagitis: Secondary | ICD-10-CM | POA: Diagnosis not present

## 2024-01-11 DIAGNOSIS — Z951 Presence of aortocoronary bypass graft: Secondary | ICD-10-CM | POA: Diagnosis not present

## 2024-01-11 DIAGNOSIS — Z0000 Encounter for general adult medical examination without abnormal findings: Secondary | ICD-10-CM | POA: Diagnosis not present

## 2024-04-24 DIAGNOSIS — I2510 Atherosclerotic heart disease of native coronary artery without angina pectoris: Secondary | ICD-10-CM | POA: Diagnosis not present

## 2024-05-07 DIAGNOSIS — H524 Presbyopia: Secondary | ICD-10-CM | POA: Diagnosis not present

## 2024-05-07 DIAGNOSIS — H43813 Vitreous degeneration, bilateral: Secondary | ICD-10-CM | POA: Diagnosis not present

## 2024-05-07 DIAGNOSIS — H2513 Age-related nuclear cataract, bilateral: Secondary | ICD-10-CM | POA: Diagnosis not present

## 2024-05-13 DIAGNOSIS — I2510 Atherosclerotic heart disease of native coronary artery without angina pectoris: Secondary | ICD-10-CM | POA: Diagnosis not present

## 2024-05-13 DIAGNOSIS — E7800 Pure hypercholesterolemia, unspecified: Secondary | ICD-10-CM | POA: Diagnosis not present

## 2024-05-23 DIAGNOSIS — I2510 Atherosclerotic heart disease of native coronary artery without angina pectoris: Secondary | ICD-10-CM | POA: Diagnosis not present

## 2024-06-13 DIAGNOSIS — E7800 Pure hypercholesterolemia, unspecified: Secondary | ICD-10-CM | POA: Diagnosis not present

## 2024-06-13 DIAGNOSIS — I2510 Atherosclerotic heart disease of native coronary artery without angina pectoris: Secondary | ICD-10-CM | POA: Diagnosis not present

## 2024-06-16 ENCOUNTER — Encounter (HOSPITAL_COMMUNITY): Payer: Medicare Other

## 2024-06-22 DIAGNOSIS — I2510 Atherosclerotic heart disease of native coronary artery without angina pectoris: Secondary | ICD-10-CM | POA: Diagnosis not present

## 2024-08-05 ENCOUNTER — Other Ambulatory Visit: Payer: Self-pay | Admitting: Cardiology

## 2024-08-31 ENCOUNTER — Other Ambulatory Visit: Payer: Self-pay | Admitting: Cardiology

## 2024-09-05 ENCOUNTER — Other Ambulatory Visit: Payer: Self-pay | Admitting: Cardiology

## 2024-09-12 ENCOUNTER — Other Ambulatory Visit: Payer: Self-pay | Admitting: Cardiology
